# Patient Record
Sex: Female | Born: 1975 | Race: White | Hispanic: No | Marital: Married | Smoking: Never smoker
Health system: Southern US, Community
[De-identification: ages and names within clinical notes are randomized; demographics above are authoritative.]

---

## 2013-05-12 ENCOUNTER — Ambulatory Visit (INDEPENDENT_AMBULATORY_CARE_PROVIDER_SITE_OTHER): Payer: BC Managed Care – PPO | Admitting: Obstetrics and Gynecology

## 2013-05-12 ENCOUNTER — Telehealth: Payer: Self-pay | Admitting: Obstetrics and Gynecology

## 2013-05-12 ENCOUNTER — Encounter: Payer: Self-pay | Admitting: Obstetrics and Gynecology

## 2013-05-12 VITALS — BP 100/70 | HR 80 | Ht 70.0 in | Wt 158.5 lb

## 2013-05-12 DIAGNOSIS — Z01419 Encounter for gynecological examination (general) (routine) without abnormal findings: Secondary | ICD-10-CM

## 2013-05-12 DIAGNOSIS — Z0000 Encounter for general adult medical examination without abnormal findings: Secondary | ICD-10-CM

## 2013-05-12 LAB — POCT URINALYSIS DIPSTICK
Bilirubin, UA: NEGATIVE
Blood, UA: NEGATIVE
Glucose, UA: NEGATIVE
Ketones, UA: NEGATIVE
Leukocytes, UA: NEGATIVE
Nitrite, UA: NEGATIVE
pH, UA: 5

## 2013-05-12 MED ORDER — NORETHINDRONE 0.35 MG PO TABS: 1.0000 | ORAL_TABLET | Freq: Every day | ORAL | Status: DC

## 2013-05-12 NOTE — Progress Notes (Signed)
Patient ID: Anna Sellers, female   DOB: 05-Dec-1975, 37 y.o.   MRN: [id number] GYNECOLOGY VISIT  PCP: NONE  Referring provider:   HPI: 37 y.o.   Married  Caucasian  female   G2P2002 with No LMP recorded. Patient is not currently having periods (Reason: Lactating).   here for  AEX Had a vaginal delivery 7 months ago.   Breast feeding.  On Camilla birth control.   Wants refill.  Patient had prolapse after first delivery and used Colpectrin.  Not having any recurrence of symptoms.  No real urinary incontinence.  Voiding pattern is somewhat intermittent.    Labs today:    Hgb: not done  Urine:  Neg  GYNECOLOGIC HISTORY: No LMP recorded. Patient is not currently having periods (Reason: Lactating). Menses:  breastfeeding Bleeding:  none  Sexually active:  yes Partner preference: female Contraception:  oral progesterone-only contraceptive Hormone therapy:  DES exposure:  denies Blood transfusions:  none Sexually transmitted diseases:  The patient denies history of sexually transmitted disease. Previous GYN Procedures:  none Last mammogram:  normal Date: January 2013 - density tissue noted in general.  No specific mass seen.  Never had any follow up as became pregnant.      Location:    [place]. Last pap:  normal Date:  2013? History of abnormal pap smear:  no   OB History   Grav Para Term Preterm Abortions TAB SAB Ect Mult Living   2 2 2       2        LIFESTYLE: Exercise:   walking         Tobacco:  no Alcohol: 5-7 glasses of wine/beer per week Drug use:  no  OTHER HEALTH MAINTENANCE: Last tetanus/TDap:   Fall 2013.  Gardisil: never Last Influenza:  06/2012 Zostavax:  never  Last bone density: never Last colonoscopy: never  Last cholesterol check: 2013 wnl  Family History  Problem Relation Age of Onset  . Breast cancer Mother   . Hypertension Mother   . Hyperlipidemia Mother   . Diabetes Father     diet controlled    There are no active problems to display for  this patient.   History reviewed. No pertinent past medical history.  History reviewed. No pertinent past surgical history.  ALLERGIES: Review of patient's allergies indicates no known allergies.  Current Outpatient Prescriptions  Medication Sig Dispense Refill  . norethindrone (ERRIN) 0.35 MG tablet Take 1 tablet by mouth daily.       No current facility-administered medications for this visit.     ROS:  Pertinent items are noted in HPI.  SOCIAL HISTORY:    Married.  Husband is a IT consultant in music at [employer].  They were living in [place] before moving to [place].   PHYSICAL EXAMINATION:    BP 100/70  Pulse 80  Ht 5\' 10"  (1.778 m)  Wt 158 lb 8 oz (71.895 kg)  BMI 22.74 kg/m2   Wt Readings from Last 3 Encounters:  05/12/13 158 lb 8 oz (71.895 kg)     Ht Readings from Last 3 Encounters:  05/12/13 5\' 10"  (1.778 m)    General appearance: alert, cooperative and appears stated age Head: Normocephalic, without obvious abnormality, atraumatic Neck: no adenopathy, supple, symmetrical, trachea midline and thyroid not enlarged, symmetric, no tenderness/mass/nodules Lungs: clear to auscultation bilaterally Breasts: Inspection negative, No nipple retraction or dimpling, No nipple discharge or bleeding, No axillary or supraclavicular adenopathy, Normal to palpation without dominant  masses Heart: regular rate and rhythm Abdomen: soft, non-tender;  no masses,  no organomegaly Extremities: extremities normal, atraumatic, no cyanosis or edema Skin: Skin color, texture, turgor normal. No rashes or lesions Lymph nodes: Cervical, supraclavicular, and axillary nodes normal. No abnormal inguinal nodes palpated Neurologic: Grossly normal   Pelvic: External genitalia:  no lesions              Urethra:  normal appearing urethra with no masses, tenderness or lesions              Bartholins and Skenes: normal                 Vagina: normal appearing vagina with normal color and  discharge, no lesions              Cervix: normal appearance              Pap taken: yes.  High risk HPV testing yes.        Bimanual Exam:  Uterus:  uterus is normal size, shape, consistency and nontender                                      Adnexa: normal adnexa in size, nontender and no masses                                        ASSESSMENT Normal gynecologic exam. Breast feeding. Prior mammogram showing a dense tissue in general.   PLAN  Mammogram age 16 and after stopping breast feeding for three months. Pap smear and high risk HPV testing Counseled on breast self exam, mammography screening STD screening performed:  no. Declines labs. Medications per Epic orders - Camilla. Patient may choose to switch to combined OCP when she complete childbearing.  Return annually or prn   An After Visit Summary was printed and given to the patient.

## 2013-05-12 NOTE — Patient Instructions (Addendum)

## 2014-01-28 ENCOUNTER — Ambulatory Visit
Admission: RE | Admit: 2014-01-28 | Discharge: 2014-01-28 | Disposition: A | Discharge status: Home / Self Care | Payer: BC Managed Care – PPO | Source: Ambulatory Visit | Attending: Family Medicine | Admitting: Family Medicine

## 2014-01-28 ENCOUNTER — Other Ambulatory Visit: Payer: Self-pay | Admitting: Family Medicine

## 2014-01-28 DIAGNOSIS — M545 Low back pain, unspecified: Secondary | ICD-10-CM

## 2014-01-28 DIAGNOSIS — M25559 Pain in unspecified hip: Secondary | ICD-10-CM

## 2014-02-16 ENCOUNTER — Other Ambulatory Visit: Payer: Self-pay | Admitting: Obstetrics and Gynecology

## 2014-02-16 ENCOUNTER — Telehealth: Payer: Self-pay | Admitting: Obstetrics and Gynecology

## 2014-02-16 MED ORDER — NORETHIN ACE-ETH ESTRAD-FE 1-20 MG-MCG PO TABS: 1.0000 | ORAL_TABLET | Freq: Every day | ORAL | Status: DC

## 2014-02-16 NOTE — Telephone Encounter (Signed)
Rx sent to her pharmacy for the [place] 1/20 for 3 months until annual exam is due.  Use back up protection for the first month.

## 2014-02-16 NOTE — Telephone Encounter (Signed)
Patient calling wanting to switch to another birth control since she has weaned her daughter. She wants to switch from micronor to [facility] 1/20.   [facility]  [address]

## 2014-02-17 NOTE — Telephone Encounter (Signed)
Patient notified.  Verbalized understanding. 

## 2014-05-05 ENCOUNTER — Telehealth: Payer: Self-pay | Admitting: Obstetrics and Gynecology

## 2014-05-05 MED ORDER — NORETHIN ACE-ETH ESTRAD-FE 1-20 MG-MCG PO TABS: 1.0000 | ORAL_TABLET | Freq: Every day | ORAL | Status: DC

## 2014-05-05 NOTE — Telephone Encounter (Signed)
Last refilled: 02/16/14 #3 packs with no refills Last AEX: 05/12/13 with Dr. Leretha Dykes Scheduled: 05/20/14 with Dr. Moody Bruins 1/20 #1 pack with1 refill sent to pharmacy to last patient until AEX.  LM on patient's vm (confirmed name) that rx has been sent.  Routed to provider for review, encounter closed.

## 2014-05-05 NOTE — Telephone Encounter (Signed)
Patient needs refill for norethindrone-ethinyl estradiol (JUNEL FE,GILDESS FE,LOESTRIN FE) 1-20 MG-MCG tablet  Take 1 tablet by mouth daily., Starting 02/16/2014, Until Discontinued, Normal, Last Dose: Not Recorded  Refills: 2 ordered Pharmacy: [facility] [place]   aex scheduled for 05/20/14

## 2014-05-20 ENCOUNTER — Encounter: Payer: Self-pay | Admitting: Obstetrics and Gynecology

## 2014-05-20 ENCOUNTER — Ambulatory Visit (INDEPENDENT_AMBULATORY_CARE_PROVIDER_SITE_OTHER): Payer: BC Managed Care – PPO | Admitting: Obstetrics and Gynecology

## 2014-05-20 VITALS — BP 102/80 | HR 72 | Resp 18 | Ht 70.0 in | Wt 160.0 lb

## 2014-05-20 DIAGNOSIS — Z01419 Encounter for gynecological examination (general) (routine) without abnormal findings: Secondary | ICD-10-CM

## 2014-05-20 MED ORDER — NORETHIN ACE-ETH ESTRAD-FE 1-20 MG-MCG PO TABS: 1.0000 | ORAL_TABLET | Freq: Every day | ORAL | Status: DC

## 2014-05-20 NOTE — Progress Notes (Signed)
GYNECOLOGY VISIT  PCP:   Referring provider:   HPI: 38 y.o.   Married  Caucasian  female   G2P2002 with Patient's last menstrual period was 05/08/2014.   here for   Annual Gynecological Examination  Menses are light and regular on OCPs.  Wants to continue.  Happy with this.   No leakage of urine or difficulty with bowel function.   Hgb:  PCP Urine:  PCP  GYNECOLOGIC HISTORY: Patient's last menstrual period was 05/08/2014. Sexually active:  yes Partner preference: female Contraception:   OCP Menopausal hormone therapy: None DES exposure: None   Blood transfusions: None   Sexually transmitted diseases: None   GYN procedures and prior surgeries: None Last mammogram:  12/2011 Normal               Last pap and high risk HPV testing:   04/2013 Neg. HR HPV neg History of abnormal pap smear:  No   OB History   Grav Para Term Preterm Abortions TAB SAB Ect Mult Living   2 2 2       2        LIFESTYLE: Exercise:  Yes, swimming, gym 4x weekly              OTHER HEALTH MAINTENANCE: Tetanus/TDap: 2013 HPV: No Influenza: No     Bone density:N/A Colonoscopy:N/A  Cholesterol check: 2014  Family History  Problem Relation Age of Onset  . Breast cancer Mother   . Hypertension Mother   . Hyperlipidemia Mother   . Diabetes Father     diet controlled    There are no active problems to display for this patient.  History reviewed. No pertinent past medical history.  History reviewed. No pertinent past surgical history.  ALLERGIES: Review of patient's allergies indicates no known allergies.  Current Outpatient Prescriptions  Medication Sig Dispense Refill  . ibuprofen (ADVIL,MOTRIN) 800 MG tablet Take 800 mg by mouth every 8 (eight) hours as needed.       . norethindrone-ethinyl estradiol (JUNEL FE,GILDESS FE,LOESTRIN FE) 1-20 MG-MCG tablet Take 1 tablet by mouth daily.  1 Package  1   No current facility-administered medications for this visit.     ROS:  Pertinent items  are noted in HPI.  History   Social History  . Marital Status: Married    Spouse Name: N/A    Number of Children: N/A  . Years of Education: N/A   Occupational History  . Not on file.   Social History Main Topics  . Smoking status: Never Smoker   . Smokeless tobacco: Never Used  . Alcohol Use: 3.5 - 6.0 oz/week    7-12 drink(s) per week     Comment: 5-7 glasses of wine/beer per week  . Drug Use: No  . Sexual Activity: Yes    Birth Control/ Protection: Pill     Comment: Errin   Other Topics Concern  . Not on file   Social History Narrative  . No narrative on file    PHYSICAL EXAMINATION:    BP 102/80  Pulse 72  Resp 18  Ht 5' 10"  (1.778 m)  Wt 160 lb (72.576 kg)  BMI 22.96 kg/m2  LMP 05/08/2014   Wt Readings from Last 3 Encounters:  05/20/14 160 lb (72.576 kg)  05/12/13 158 lb 8 oz (71.895 kg)     Ht Readings from Last 3 Encounters:  05/20/14 5' 10"  (1.778 m)  05/12/13 5' 10"  (1.778 m)    General appearance: alert, cooperative and  appears stated age Head: Normocephalic, without obvious abnormality, atraumatic Neck: no adenopathy, supple, symmetrical, trachea midline and thyroid not enlarged, symmetric, no tenderness/mass/nodules Lungs: clear to auscultation bilaterally Breasts: Inspection negative, No nipple retraction or dimpling, No nipple discharge or bleeding, No axillary or supraclavicular adenopathy, Normal to palpation without dominant masses Heart: regular rate and rhythm Abdomen: soft, non-tender; no masses,  no organomegaly Extremities: extremities normal, atraumatic, no cyanosis or edema Skin: Skin color, texture, turgor normal. No rashes or lesions Lymph nodes: Cervical, supraclavicular, and axillary nodes normal. No abnormal inguinal nodes palpated Neurologic: Grossly normal  Pelvic: External genitalia:  no lesions              Urethra:  normal appearing urethra with no masses, tenderness or lesions              Bartholins and Skenes: normal                  Vagina: normal appearing vagina with normal color and discharge, no lesions              Cervix: normal appearance              Pap and high risk HPV testing done: No.        Bimanual Exam:  Uterus:  uterus is normal size, shape, consistency and nontender                                      Adnexa: normal adnexa in size, nontender and no masses                                      Rectovaginal:  Yes.                                        Confirms above.                                      Anus:  normal sphincter tone, no lesions  ASSESSMENT  Normal gynecologic exam.  PLAN  Mammogram recommended yearly starting at age 35.  Patient will schedule at [facility].  Pap smear and high risk HPV testing as above. Rx for LoEstrin 1/20.  See orders. Counseled on self breast exam, Calcium and vitamin D intake, exercise, Kegels. See lab orders: No. Return annually or prn   An After Visit Summary was printed and given to the patient.

## 2014-05-20 NOTE — Patient Instructions (Signed)

## 2014-05-21 ENCOUNTER — Other Ambulatory Visit: Payer: Self-pay

## 2014-05-21 DIAGNOSIS — Z1231 Encounter for screening mammogram for malignant neoplasm of breast: Secondary | ICD-10-CM

## 2014-06-08 ENCOUNTER — Ambulatory Visit
Admission: RE | Admit: 2014-06-08 | Discharge: 2014-06-08 | Disposition: A | Discharge status: Home / Self Care | Payer: BC Managed Care – PPO | Source: Ambulatory Visit

## 2014-06-08 DIAGNOSIS — Z1231 Encounter for screening mammogram for malignant neoplasm of breast: Secondary | ICD-10-CM

## 2014-07-20 ENCOUNTER — Encounter: Payer: Self-pay | Admitting: Obstetrics and Gynecology

## 2015-04-21 ENCOUNTER — Encounter: Payer: Self-pay | Admitting: Sports Medicine

## 2015-04-21 ENCOUNTER — Ambulatory Visit (INDEPENDENT_AMBULATORY_CARE_PROVIDER_SITE_OTHER): Payer: BC Managed Care – PPO | Admitting: Sports Medicine

## 2015-04-21 VITALS — BP 143/89 | Ht 70.0 in | Wt 155.0 lb

## 2015-04-21 DIAGNOSIS — R269 Unspecified abnormalities of gait and mobility: Secondary | ICD-10-CM | POA: Insufficient documentation

## 2015-04-21 DIAGNOSIS — M2141 Flat foot [pes planus] (acquired), right foot: Secondary | ICD-10-CM | POA: Diagnosis not present

## 2015-04-21 DIAGNOSIS — M2142 Flat foot [pes planus] (acquired), left foot: Secondary | ICD-10-CM

## 2015-04-21 DIAGNOSIS — M25551 Pain in right hip: Secondary | ICD-10-CM

## 2015-04-21 DIAGNOSIS — M6798 Unspecified disorder of synovium and tendon, other site: Secondary | ICD-10-CM | POA: Diagnosis not present

## 2015-04-21 DIAGNOSIS — M67951 Unspecified disorder of synovium and tendon, right thigh: Secondary | ICD-10-CM | POA: Insufficient documentation

## 2015-04-21 NOTE — Assessment & Plan Note (Signed)
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 Negative low back and hip imaging.  No suggestion of hip joint or SI joint pathology based on exam.  She does appear to have some asymmetry in her right>left hip stabilizing muscles and had a + trendelenberg on the the right.   - In addition to current core strengthening work, we have given Anna Sellers a handout to work on straight leg hip abduction as well as standing rotation.  - She can take NSAIDs PRN, but this should not be a long term answer.  An injection may also be a possibility in the future.  - We also added lateral ray posts to inserts to help with her over supination.  She has had orthotics in the past that had helped her.  - f/u in 6 weeks.

## 2015-04-21 NOTE — Progress Notes (Signed)
  Anna Sellers - 39 y.o. female MRN [id number]  Date of birth: March 19, 1976  CC: Right Hip Buttock  SUBJECTIVE:   HPI Right>Left Hip/Buttock pain x 4 years:  -  The pain is aching on the lateral edge of her glut.  No radiating pain down the leg.  No loss of bowel or bladder control.  No pain with bending forward or backwards.  - previously walked more until this pain began - Sitting bothers her in addition with walking.  - Started/aggravated during pregnancies in the last few years.  - Aggravated during workout/kickboxing class following the first pregnancy - She has tried a long term course of ibuprofen (2 weeks, 874m). This did help, but the pain returned.  PRN ibuprofen also seems to help.  - No pain down the leg. Some back soreness as well.  - Still does yoga several times a week as well as swimming 4 miles/week (avoids breast stroke.  - She recently underwent a 4 month course of PT that fail to resolve her symptoms.  They used various modalities to treat pain.      - One exercise that hurt her SI joint were planks, this pain was different than her normal pain.     - She has been doing many core strengthening exercises, but nothing specifically for leg abduction strength   HISTORY: Past Medical, Surgical, Social, and Family History Reviewed & Updated per EMR.    OBJECTIVE: BP 143/89 mmHg  Ht 5' 10"  (1.778 m)  Wt 155 lb (70.308 kg)  BMI 22.24 kg/m2  Physical Exam  Calm, no distress Resp: non-labored, speaking in full sentences Skin: no rash or obvious skin lesion. Back Exam: Inspection: negative Motion: unrestricted. SLR seated: negative Seated HS Flexibility: to 0 degrees b/l Palpable tenderness: none FABER: negative Sensory change: symmetric Reflex change: symmetric  Strength at foot Plantar-flexion:  5/ 5    Dorsi-flexion: 5 / 5    Eversion:  5/ 5   Inversion: 5 / 5 Leg strength equal Quad: 5 / 5   Hamstring: 5 / 5   Hip flexor:  5/ 5   Hip abductors:  5/ 5 Gait:  supinates Stands with feet minimally externally rotated Pes planus.  Hip: b/l ROM IR: 80 Deg, ER: 80 Deg, Flexion: 120 Deg, Extension: 100 Deg, Abduction: 45 Deg, Adduction: 45 Deg Strength IR: 5/5, ER: 5/5, Flexion: 5/5, Extension: 5/5, Abduction: 5/5 (not as strong as possible), Adduction: 5/5 Pelvic alignment unremarkable to inspection and palpation. Standing hip rotation and gait with trendelenburg when standing on right >left leg.   Greater trochanter without tenderness to palpation.  No tenderness over piriformis origin, but pain was present over the insertion of the hip abductors/external rotators.  No SI joint tenderness and normal minimal SI movement. Negative compression and distraction.  MEDICATIONS, LABS & OTHER ORDERS: Previous Medications   IBUPROFEN (ADVIL,MOTRIN) 800 MG TABLET    Take 800 mg by mouth every 8 (eight) hours as needed.    NORETHINDRONE-ETHINYL ESTRADIOL (JUNEL FE,GILDESS FE,LOESTRIN FE) 1-20 MG-MCG TABLET    Take 1 tablet by mouth daily.   Modified Medications   No medications on file   New Prescriptions   No medications on file   Discontinued Medications   No medications on file  No orders of the defined types were placed in this encounter.   ASSESSMENT & PLAN: See problem based charting & AVS for pt instructions.

## 2015-05-03 ENCOUNTER — Encounter: Payer: Self-pay | Admitting: Obstetrics and Gynecology

## 2015-05-03 ENCOUNTER — Ambulatory Visit (INDEPENDENT_AMBULATORY_CARE_PROVIDER_SITE_OTHER): Payer: BC Managed Care – PPO | Admitting: Obstetrics and Gynecology

## 2015-05-03 VITALS — BP 110/76 | HR 70 | Resp 18 | Ht 70.0 in | Wt 156.8 lb

## 2015-05-03 DIAGNOSIS — Z01419 Encounter for gynecological examination (general) (routine) without abnormal findings: Secondary | ICD-10-CM

## 2015-05-03 DIAGNOSIS — Z0000 Encounter for general adult medical examination without abnormal findings: Secondary | ICD-10-CM | POA: Diagnosis not present

## 2015-05-03 LAB — POCT URINALYSIS DIPSTICK
Bilirubin, UA: NEGATIVE
Glucose, UA: NEGATIVE
Ketones, UA: NEGATIVE
Leukocytes, UA: NEGATIVE
Nitrite, UA: NEGATIVE
PH UA: 5
Protein, UA: NEGATIVE
RBC UA: NEGATIVE
UROBILINOGEN UA: NEGATIVE

## 2015-05-03 LAB — LIPID PANEL
CHOLESTEROL: 173 mg/dL (ref 125–200)
HDL: 67 mg/dL (ref >=46)
LDL Cholesterol: 87 mg/dL (ref <130)
TRIGLYCERIDES: 93 mg/dL (ref <150)
Total CHOL/HDL Ratio: 2.6 Ratio (ref <=5.0)
VLDL: 19 mg/dL (ref <30)

## 2015-05-03 LAB — COMPREHENSIVE METABOLIC PANEL
ALK PHOS: 39 U/L (ref 33–115)
ALT: 14 U/L (ref 6–29)
AST: 15 U/L (ref 10–30)
Albumin: 4.4 g/dL (ref 3.6–5.1)
BILIRUBIN TOTAL: 0.4 mg/dL (ref 0.2–1.2)
BUN: 9 mg/dL (ref 7–25)
CO2: 25 mmol/L (ref 20–31)
Calcium: 9.6 mg/dL (ref 8.6–10.2)
Chloride: 104 mmol/L (ref 98–110)
Creat: 0.84 mg/dL (ref 0.50–1.10)
Glucose, Bld: 96 mg/dL (ref 65–99)
POTASSIUM: 4.3 mmol/L (ref 3.5–5.3)
Sodium: 142 mmol/L (ref 135–146)
TOTAL PROTEIN: 6.9 g/dL (ref 6.1–8.1)

## 2015-05-03 LAB — CBC
HCT: 38.4 % (ref 36.0–46.0)
Hemoglobin: 13.2 g/dL (ref 12.0–15.0)
MCH: 30.7 pg (ref 26.0–34.0)
MCHC: 34.4 g/dL (ref 30.0–36.0)
MCV: 89.3 fL (ref 78.0–100.0)
MPV: 9.7 fL (ref 8.6–12.4)
PLATELETS: 306 10*3/uL (ref 150–400)
RBC: 4.3 MIL/uL (ref 3.87–5.11)
RDW: 13.2 % (ref 11.5–15.5)
WBC: 7.2 10*3/uL (ref 4.0–10.5)

## 2015-05-03 MED ORDER — NORETHIN ACE-ETH ESTRAD-FE 1-20 MG-MCG PO TABS: 1.0000 | ORAL_TABLET | Freq: Every day | ORAL | Status: DC

## 2015-05-03 NOTE — Progress Notes (Signed)
Patient ID: Anna Sellers, female   DOB: 08/04/1976, 39 y.o.   MRN: [id number] 39 y.o. G2P2002 Married Caucasian female here for annual exam.    Patient on OCPs--not continuously but doesn't have a cycle. This occurring for 1 year since weaned off breast feeding.  Child is now 48.30 years old. No missed pills.  Does pregnancy test every now and then and her tests are negative.  Happy not to have cycles. No hot flashes or night sweats.  No history of post partum hemorrhage. No nipple discharge.  Right gluteal and hip pain.  Doing PT.  Saw a new MD recently.   Daughter started kindergarten.   PCP:   Antony Contras, MD  No LMP recorded. Patient is not currently having periods (Reason: Oral contraceptives).          Sexually active: Yes.   female The current method of family planning is OCP (estrogen/progesterone).--Loestrin Fe  Exercising: Yes.    swimming, yoga and zumba. Smoker:  no  Health Maintenance: Pap:  05-12-13 Neg:Neg HR HPV History of abnormal Pap:  no MMG:  06-08-14 Density Cat.C/Neg:[facility] Colonoscopy:  n/a BMD:   n/a  Result  n/a TDaP:  2013 Screening Labs:  Hb today:--- Urine today: Neg   reports that she has never smoked. She has never used smokeless tobacco. She reports that she drinks about 3.5 - 6.0 oz of alcohol per week. She reports that she does not use illicit drugs.  History reviewed. No pertinent past medical history.  History reviewed. No pertinent past surgical history.  Current Outpatient Prescriptions  Medication Sig Dispense Refill  . ibuprofen (ADVIL,MOTRIN) 800 MG tablet Take 800 mg by mouth every 8 (eight) hours as needed.     . norethindrone-ethinyl estradiol (JUNEL FE,GILDESS FE,LOESTRIN FE) 1-20 MG-MCG tablet Take 1 tablet by mouth daily. 3 Package 3   No current facility-administered medications for this visit.    Family History  Problem Relation Age of Onset  . Breast cancer Mother   . Hypertension Mother   . Hyperlipidemia Mother    . Diabetes Father     diet controlled    ROS:  Pertinent items are noted in HPI.  Otherwise, a comprehensive ROS was negative.  Exam:   BP 110/76 mmHg  Pulse 70  Resp 18  Ht 5' 10"  (1.778 m)  Wt 156 lb 12.8 oz (71.124 kg)  BMI 22.50 kg/m2  LMP     General appearance: alert, cooperative and appears stated age Head: Normocephalic, without obvious abnormality, atraumatic Neck: no adenopathy, supple, symmetrical, trachea midline and thyroid normal to inspection and palpation Lungs: clear to auscultation bilaterally Breasts: normal appearance, no masses or tenderness, Inspection negative, No nipple retraction or dimpling, No nipple discharge or bleeding Heart: regular rate and rhythm Abdomen: soft, non-tender; bowel sounds normal; no masses,  no organomegaly Extremities: extremities normal, atraumatic, no cyanosis or edema Skin: Skin color, texture, turgor normal. No rashes or lesions Lymph nodes: Cervical, supraclavicular, and axillary nodes normal. No abnormal inguinal nodes palpated Neurologic: Grossly normal  Pelvic: External genitalia:  no lesions              Urethra:  normal appearing urethra with no masses, tenderness or lesions              Bartholins and Skenes: normal                 Vagina: normal appearing vagina with normal color and discharge, no lesions  Cervix: no lesions              Pap taken: No. Bimanual Exam:  Uterus:  normal size, contour, position, consistency, mobility, non-tender              Adnexa: normal adnexa and no mass, fullness, tenderness              Rectovaginal: Yes.  .  Confirms.              Anus:  normal sphincter tone, no lesions  Chaperone was present for exam.  Assessment:   Well woman visit with normal exam. Amenorrhea on combined OCPs.  Recent pregnancy 2.5 years ago.  FH of breast cancer.   Plan: Yearly mammogram recommended after age 65. Patient will schedule 3D. Recommended self breast exam.  Pap and HR HPV as  above. Discussed Calcium, Vitamin D, regular exercise program including cardiovascular and weight bearing exercise. Labs performed.  Yes.  .   See orders.  TSH, Lipids, CMP, CBC.  Refills given on medications.  Yes.  .  See orders.  Loestrin 1/20 for one year.  If want to test hormone levels further, will need to stop OCPs for 2 weeks.  I think it is OK not to do this at this time.   Follow up annually and prn.      After visit summary provided.

## 2015-05-04 ENCOUNTER — Telehealth: Payer: Self-pay

## 2015-05-04 LAB — TSH: TSH: 0.984 u[IU]/mL (ref 0.350–4.500)

## 2015-05-04 NOTE — Telephone Encounter (Signed)
Left message to call Alexias Margerum at [phone]. 

## 2015-05-04 NOTE — Telephone Encounter (Signed)
-----   Message from Nunzio Cobbs, MD sent at 05/04/2015  7:20 AM EDT ----- Please report normal labs to patient including lipids, TSH, CBC, and CMP.   Cc- Marisa Sprinkles

## 2015-05-04 NOTE — Telephone Encounter (Signed)
Spoke with patient. Advised of results as seen below from [place]. Patient is agreeable and verbalizes understanding.  Routing to provider for final review. Patient agreeable to disposition. Will close encounter.   Patient aware provider will review message and nurse will return call if any additional advice or change of disposition.

## 2015-05-10 ENCOUNTER — Other Ambulatory Visit: Payer: Self-pay

## 2015-05-10 DIAGNOSIS — Z1231 Encounter for screening mammogram for malignant neoplasm of breast: Secondary | ICD-10-CM

## 2015-06-02 ENCOUNTER — Encounter: Payer: Self-pay | Admitting: Sports Medicine

## 2015-06-02 ENCOUNTER — Ambulatory Visit (INDEPENDENT_AMBULATORY_CARE_PROVIDER_SITE_OTHER): Payer: BC Managed Care – PPO | Admitting: Sports Medicine

## 2015-06-02 VITALS — BP 137/80 | Ht 70.0 in | Wt 157.0 lb

## 2015-06-02 DIAGNOSIS — M2142 Flat foot [pes planus] (acquired), left foot: Secondary | ICD-10-CM | POA: Diagnosis not present

## 2015-06-02 DIAGNOSIS — M25551 Pain in right hip: Secondary | ICD-10-CM | POA: Diagnosis not present

## 2015-06-02 DIAGNOSIS — M2141 Flat foot [pes planus] (acquired), right foot: Secondary | ICD-10-CM

## 2015-06-02 NOTE — Progress Notes (Signed)
   Subjective:    Patient ID: Michail Jewels, female    DOB: [birth year], 39 y.o.   MRN: [id number]  HPI   Patient comes in today for follow-up on chronic right hip pain. When she initially attempted to do her hip exercises she began to experience increasing pain. As a consequence she decided to not only stop the exercises for a period of time but to also discontinue swimming and yoga. She initially had some symptom improvement but when her pain returned she decided to reintroduce the hip exercises. This time she did them with resistance and has noticed some improvement. She still localizes some of her discomfort to her right SI joint but she also gets pain along the posterior lateral right hip. She has not really had a chance to use her new inserts. They're in her tennis shoes and she wears flats most of the time. She would like to try some inserts in shoes instead. She has worked in the past with Barbaraann Barthel and although she had a good experience she is asking if there are other therapists in town that she may be able to work with. She continues to deny groin pain. She continues to deny numbness or tingling. Her symptoms have been present for about 4 years and started after a pregnancy.    Review of Systems     Objective:   Physical Exam Well-developed, thin appearing. No acute distress. Awake alert and oriented 3. Vital signs reviewed.  Lumbar spine: Good lumbar range of motion. Positive Faber's on the right. Negative straight leg raise Right hip: Smooth painless hip range of motion. Negative logroll. She does have improvement with resisted hip abduction and has a less noticeable Trendelenburg than on her previous visit. She is tender to palpation along the posterior lateral hip diffusely.  Walking without a limp  X-rays of her right hip and lumbar spine are reviewed. She has lumbarlization of the S1 segment. Otherwise unremarkable.       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 She can then transition into step ups and crossovers. I've also given her some more core exercises, specifically bridging and pointer poses. I do think she would do well with some supervised physical therapy. I provided her with the name of Kym Groom. I've also provided her with the information for PT Pilates. I've given her a new pair of green sports insoles for her flats to which I have added a lateral post. If these are uncomfortable she may return to using her other inserts. I would like to see her back in the office in 8 weeks. I think the lumbaralization at the S1 segment is likely providing a little extra motion in the lower lumbar spine which is all the more reason for her to work diligently on core strengthening. I think she is safe to continue recreational exercise using pain as her guide.

## 2015-06-17 ENCOUNTER — Ambulatory Visit
Admission: RE | Admit: 2015-06-17 | Discharge: 2015-06-17 | Disposition: A | Discharge status: Home / Self Care | Payer: BC Managed Care – PPO | Source: Ambulatory Visit

## 2015-06-17 DIAGNOSIS — Z1231 Encounter for screening mammogram for malignant neoplasm of breast: Secondary | ICD-10-CM

## 2015-07-21 ENCOUNTER — Ambulatory Visit (INDEPENDENT_AMBULATORY_CARE_PROVIDER_SITE_OTHER): Payer: BC Managed Care – PPO | Admitting: Sports Medicine

## 2015-07-21 VITALS — BP 130/71

## 2015-07-21 DIAGNOSIS — M25551 Pain in right hip: Secondary | ICD-10-CM

## 2015-07-21 NOTE — Progress Notes (Signed)
Subjective:    Patient ID: Anna Sellers, female    DOB: [birth year], 39 y.o.   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 This is chronic in nature and has been going on for over 4 years.  At her last appointment she was recommended to go to formal physical therapy with a specified therapist and continue her external rotation exercises in addition to hip abduction. The patient has not gone to the specified therapist. However, she has started to perform to group classes per week of Pilates by a physical therapist. She also attended 2 private sessions with this physical therapist and has been performing home exercises prescribed at that time at least 3 days per week. Her primary reason for stopping her external rotation exercises at home was that they provoked her symptoms and caused too much pain. She also cannot tolerate hip abduction, adduction or rotation exercises with weights on machines at the gym secondary to pain/flaring her symptoms.   The patient has not been doing dedicated core strengthening other than the above. She is not competing in any sports or taking opening exercises. She reports that her pain is most bothersome at night or the end of that today. Also provoked by having to pick up/put down her children several times a day.  She rates her pain as approximately 15-25 percent better since her last appointment   Past medical history, past surgical history both reviewed Medications reviewed; unremarkable aside for over-the-counter ibuprofen and OCP Social history; rare/social EtOH. Nonsmoker. Works as a Network engineer at [employer].   Review of Systems 10 point review of systems was negative other than the above    Objective:   Physical Exam  General: -- Well appearing and in NAD; resting comfortably on exam  table  Cardiopulmonary: -- Non-labored respirations  Skin/derm: -- No rashes present on the examined skin of abdomen/chest/back and the bilateral lower extremities  Psych: -- Appropriate mood/affect; normal thought content  Musculoskeletal/neurology exam: -No abnormalities on inspection of the bilateral hips and low back -4+/5 strength with hip external rotation, adduction and abduction on the right side. -5/5 strength in those movements of the left hip; and 5/5 strength in knee extension, hip flexion, great toe extension and dorsiflexion/plantarflexion of the ankle bilaterally  -2/4 DTRs of the patellar and Achilles tendons bilaterally  -Negative straight leg raise bilaterally; negative Faber bilaterally; negative slump test bilaterally  -The patient is exquisitely tender to palpation along the piriformis muscle and other external rotators of the hip. -She is also very tender to palpation on the posterior aspect of the greater trochanter  -She had a negative noble test and negative Ober test    Assessment & Plan:   #1. 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 She's had limited improvement with bodies with a physical therapist. She has not been completing her external rotation exercises nor using any weighted exercises at the gym and they provoked too many symptoms. She has not seen a physical therapist as recommended at the last appointment. As she was hoping that Pilates with a physical therapist would resolve symptoms or provide enough improvement.  Discussed her limited improvement in great detail. The patient agrees that her current improvement is not at goal, especially having undergone FOUR different rounds of dedicated physical  therapy (including Pilates) over the last 4 years. She agrees that a definitive answer is in her best interest.  Plan: -Begin dedicated physical therapy  for piriformis syndrome and right hip pain with the physical therapist recommended by our office (this will be the fifth round of therapy) -Follow-up with sports medicine in 4-6 weeks -MRI of lumbar spine and MRI of right hip if no improvement with most recent therapy -May continue acetaminophen and ibuprofen as needed for pain

## 2015-11-08 ENCOUNTER — Other Ambulatory Visit: Payer: Self-pay | Admitting: Sports Medicine

## 2015-11-08 ENCOUNTER — Ambulatory Visit (INDEPENDENT_AMBULATORY_CARE_PROVIDER_SITE_OTHER): Payer: BC Managed Care – PPO | Admitting: Sports Medicine

## 2015-11-08 ENCOUNTER — Encounter: Payer: Self-pay | Admitting: Sports Medicine

## 2015-11-08 VITALS — BP 120/55 | Ht 70.0 in | Wt 157.0 lb

## 2015-11-08 DIAGNOSIS — M6798 Unspecified disorder of synovium and tendon, other site: Secondary | ICD-10-CM

## 2015-11-08 DIAGNOSIS — M25551 Pain in right hip: Secondary | ICD-10-CM

## 2015-11-08 DIAGNOSIS — M67951 Unspecified disorder of synovium and tendon, right thigh: Secondary | ICD-10-CM

## 2015-11-08 DIAGNOSIS — M67959 Unspecified disorder of synovium and tendon, unspecified thigh: Secondary | ICD-10-CM

## 2015-11-08 NOTE — Progress Notes (Signed)
   Subjective:    Patient ID: Anna Sellers, female    DOB: June 12, 1976, 40 y.o.   MRN: [id number]  HPI   Patient comes in today for follow-up on chronic right-sided low back pain and posterior right hip pain. She has been working with Kym Groom (PT) and as a result her symptoms have improved but she will still have episodes of rather severe pain that limits her activity. Symptoms appear to localize to the right SI joint but she also gets pain into the right buttock as well. No numbness or tingling into her legs. 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 Physical therapy feels like many of her symptoms may be originating from the SI joint. She denies any groin pain. This is a chronic problem that has been present now for over 4 years.    Review of Systems     Objective:   Physical Exam  Well-developed, well-nourished. No acute distress  Right hip: Smooth painless hip range of motion with a negative log roll. 5/5 strength with resisted hip abduction on the right. No pain currently with FABER testing but this does usually cause her pain when done by the physical therapist. She has no tenderness to palpation along the greater trochanter area no tenderness along the piriformis or gluteus medius. Neurovascularly intact distally.  Patient also exhibits a few signs of hypermobility. She does have about 10 of hyperextension of both elbows. She is able to touch her hands to the floor. She does not exhibit hypermobility at the thumbs or fifth finger.       Assessment & Plan:  Chronic low back pain/posterior hip pain  I think her pain generator is multifactorial. The lumbaralization of the first sacral unit will definitely cause hypermobility of her lumbar spine. In fact, she has generalized hypermobility already. Her SI joint may also be a source of her pain. She needs to continue with generalized hip strengthening with  physical therapy. I am going to refer her for a diagnostic/therapeutic right-sided SI joint injection and the patient will call me a week after her injection to let me know how she is doing. I've explained to her that this injection will help [place] determine how much of her pain is originating from the SI joint which, in turn, will direct further treatment. Of note, she does think that dry needling has been helpful but the cost is becoming prohibitive. I provided her with the name of [facility] and she will call them to see if they do dry needling (it may be covered under her insurance with them). I've also recommended that she try some acupuncture.

## 2015-11-08 NOTE — Patient Instructions (Addendum)
Injection is schedule for:  Thursday 11/11/15 at 830a Check-in time is [place] [address] [place] [address] [phone]  Need to bring a Driver  It will be an one hour appt  No food 4 hours before your appt  You can drink before the appt

## 2015-11-11 ENCOUNTER — Ambulatory Visit
Admission: RE | Admit: 2015-11-11 | Discharge: 2015-11-11 | Disposition: A | Discharge status: Home / Self Care | Payer: BC Managed Care – PPO | Source: Ambulatory Visit | Attending: Sports Medicine | Admitting: Sports Medicine

## 2015-11-11 DIAGNOSIS — M67959 Unspecified disorder of synovium and tendon, unspecified thigh: Secondary | ICD-10-CM

## 2015-11-11 DIAGNOSIS — M25551 Pain in right hip: Secondary | ICD-10-CM

## 2015-11-11 MED ORDER — METHYLPREDNISOLONE ACETATE 40 MG/ML INJ SUSP (RADIOLOG
120.0000 mg | Freq: Once | INTRAMUSCULAR | Status: AC
  Administered 2015-11-11: 120 mg via INTRA_ARTICULAR

## 2015-11-11 MED ORDER — IOHEXOL 180 MG/ML  SOLN
1.0000 mL | Freq: Once | INTRAMUSCULAR | Status: AC | PRN
Start: 2015-11-11 — End: 2015-11-11
  Administered 2015-11-11: 1 mL via INTRA_ARTICULAR

## 2015-11-11 NOTE — Discharge Instructions (Signed)

## 2015-11-18 ENCOUNTER — Telehealth: Payer: Self-pay | Admitting: Sports Medicine

## 2015-11-18 NOTE — Telephone Encounter (Signed)
I spoke with the patient on the phone today. She had a diagnostic/therapeutic SI joint injection done last week. Some of her low back pain has improved but she is still having pain along the posterior hip around to her greater trochanteric bursa. I've explained to her that it is quite possible that she may have 2 different things going on since she did not have complete symptom resolution after her injection. I've encouraged her to continue with physical therapy and to continue to experiment with dry needling. She will follow-up with me in the office in 3 weeks and if her lateral hip pain persists then I could consider a cortisone injection here as well.

## 2015-12-08 ENCOUNTER — Ambulatory Visit (INDEPENDENT_AMBULATORY_CARE_PROVIDER_SITE_OTHER): Payer: BC Managed Care – PPO | Admitting: Sports Medicine

## 2015-12-08 ENCOUNTER — Encounter: Payer: Self-pay | Admitting: Sports Medicine

## 2015-12-08 VITALS — BP 123/86 | HR 75 | Ht 70.0 in | Wt 158.0 lb

## 2015-12-08 DIAGNOSIS — M25551 Pain in right hip: Secondary | ICD-10-CM

## 2015-12-08 NOTE — Progress Notes (Signed)
   Subjective:    Patient ID: Anna Sellers, female    DOB: [birth year], 40 y.o.   MRN: [id number]  HPI   Patient comes in today for follow-up on her chronic right hip pain. She did notice some symptom relief in the posterior hip with a recent diagnostic/therapeutic SI joint injection. However, she continues to have pain along the lateral hip which at times will radiate into the left buttock. She traces it primarily along the course of the piriformis muscle but also has some discomfort over the greater trochanteric area and hamstring. Since her last office visit she took a four-day trip to [place] and, interestingly enough, states that she was completely pain-free during that visit. However, upon her return to [place] and to work, her pain has began to return. She has started to evaluate things like her mattress and her work space to see if those may be contributing factors. In fact, her current mattress is 42 years old and she has recently purchased a new mattress which will arrive in a few days. Her job involves primarily sitting. She also understands that stress can play a role in pain. She did inquire about [facility] at [facility] and she would like a referral to them.    Review of Systems     Objective:   Physical Exam  Well-developed, well-nourished. No acute distress  Right hip: Patient has tenderness to palpation primarily along the area of the piriformis. Some tenderness over the greater trochanteric bursa. Some tenderness at the proximal hamstring as well. Smooth painless hip range of motion with a negative log roll. Neurovascularly intact distally.       Assessment & Plan:   Chronic right hip pain  Some of her symptoms have resolved with the SI joint injection. The fact that she was completely pain-free for 4 days while visiting [place] makes me think that this is definitely a biomechanical issue and not a structural problem. I agree with purchasing a new mattress and  I've also recommended that she get a standing desk at work. I will also make a referral to [facility] for treatment including dry needling. I will schedule the patient to return to the office in one month for ultrasound evaluation of her right hip and possible ultrasound-guided injection (I will schedule this patient in Dr Awanda Mink' clinic on a day that I am also here). I also think that stress may be playing a role in her symptoms and she understands this.

## 2015-12-29 ENCOUNTER — Ambulatory Visit: Payer: BC Managed Care – PPO | Attending: Sports Medicine | Admitting: Physical Therapy

## 2015-12-29 DIAGNOSIS — R252 Cramp and spasm: Secondary | ICD-10-CM | POA: Diagnosis present

## 2015-12-29 DIAGNOSIS — M25551 Pain in right hip: Secondary | ICD-10-CM | POA: Insufficient documentation

## 2015-12-29 DIAGNOSIS — M6281 Muscle weakness (generalized): Secondary | ICD-10-CM | POA: Insufficient documentation

## 2015-12-29 DIAGNOSIS — R262 Difficulty in walking, not elsewhere classified: Secondary | ICD-10-CM | POA: Insufficient documentation

## 2015-12-29 NOTE — Therapy (Signed)
[place], [place], [address] Phone: [phone]   Fax:  [phone]  Physical Therapy Evaluation  Patient Details  Name: Anna Sellers MRN: [id number] Date of Birth: 20-Jun-1976 Referring Provider: Everlean Alstrom MD  Encounter Date: 12/29/2015      PT End of Session - 12/29/15 1246    Visit Number 1   Number of Visits 12   Date for PT Re-Evaluation 02/09/16   Authorization Type BCBS   PT Start Time 1149   PT Stop Time 1240   PT Time Calculation (min) 51 min   Activity Tolerance Patient tolerated treatment well   Behavior During Therapy [facility] for tasks assessed/performed      No past medical history on file.  No past surgical history on file.  There were no vitals filed for this visit.       Subjective Assessment - 12/29/15 1159    Subjective pt is a 40 y.o F with CC of R hip/ low back pain that has been chronic. She reports it start about 5 years ago pt reports doing possibly too much exercise post-partum which she thinks it. She reports the pain has stayed the same for the last year, and has been going to year  and hasn't seen any benefit with the physical therapy. She denies N/T but rpoerts pain to the lateral apsect of the R hip.    Limitations Sitting;Lifting;Standing;Walking;House hold activities  due to pain but able to do it   How long can you sit comfortably? 30 min   How long can you stand comfortably? 15 min   How long can you walk comfortably? 20 min   Diagnostic tests 11/11/2015 SIJ injection under fluoruo   Patient Stated Goals to exercise again, to decrease pain, prolonged walking (for exercise),    Currently in Pain? Yes   Pain Score 2    Pain Location Hip   Pain Orientation Right;Lateral;Posterior   Pain Descriptors / Indicators Aching  irritating   Pain Type Chronic pain   Pain Frequency Constant   Aggravating Factors  too much exercise, any activity   Pain Relieving Factors  ibuprofen, Ice, DTM with ball, stretching,             [facility] PT Assessment - 12/29/15 0001    Assessment   Medical Diagnosis piriformis syndrome of the R hip   Referring Provider Everlean Alstrom MD   Onset Date/Surgical Date --  5 years   Hand Dominance Right   Next MD Visit 02/04/2016   Prior Therapy yes   Precautions   Precaution Comments Avoiding yoga due to possible hypermobility   Restrictions   Weight Bearing Restrictions No   Balance Screen   Has the patient fallen in the past 6 months No   Has the patient had a decrease in activity level because of a fear of falling?  No   Is the patient reluctant to leave their home because of a fear of falling?  No   Home Environment   Living Environment Private residence   Living Arrangements Spouse/significant other;Children   Available Help at Discharge Available PRN/intermittently;Available 24 hours/day   Type of Home House   Home Access Stairs to enter   Entrance Stairs-Number of Steps 1   Home Layout Two level   Alternate Level Stairs-Number of Steps 16   Prior Function   Level of Independence Independent;Independent with basic ADLs   Vocation Full time employment   Air cabin crew direction  prolonged sitting   Cognition   Overall Cognitive Status Within Functional Limits for tasks assessed   Posture/Postural Control   Posture/Postural Control Postural limitations   Postural Limitations Rounded Shoulders;Forward head;Anterior pelvic tilt   ROM / Strength   AROM / PROM / Strength AROM;PROM;Strength   AROM   Overall AROM  Within functional limits for tasks performed  bil   AROM Assessment Site Hip;Lumbar   Strength   Strength Assessment Site Hip   Right/Left Hip Right;Left   Right Hip Flexion 4/5   Right Hip Extension 4/5   Right Hip External Rotation  4/5   Right Hip ABduction 4/5   Left Hip Flexion 4+/5   Left Hip Extension 4+/5   Left Hip External Rotation 4+/5   Left Hip Internal Rotation 4+/5    Left Hip ABduction 4+/5   Palpation   Palpation comment tendneress at the R piriformis, glute medius/ minimius with palpable trigger points noted. Mild atrophy noted during palpation   Special Tests    Special Tests Sacrolliac Tests   Sacroiliac Tests  Sacral Thrust  Forward flexion testing Postive for L posterior rotation   Pelvic Dictraction   Findings Negative   Pelvic Compression   Findings Negative   Sacral thrust    Findings Negative   Ambulation/Gait   Gait Pattern Step-through pattern;Trendelenburg;Antalgic;Decreased stride length   Gait Comments decreased step length on the R compared bil                           PT Education - 12/29/15 1245    Education provided Yes   Education Details evaluation findings, POC, Goals, HEP   Person(s) Educated Patient   Methods Explanation   Comprehension Verbalized understanding          PT Short Term Goals - 12/29/15 1257    PT SHORT TERM GOAL #1   Title pt will be I with inital HEP (01/19/2016)           PT Long Term Goals - 12/29/15 1257    PT LONG TERM GOAL #1   Title pt will be I with all HEP as of last visit (02/09/2016)   Time 6   Period Weeks   Status New   PT LONG TERM GOAL #2   Title pt will improve R hip abductor/ extensor strength to >/= 4+/5 with </=1/10 pain to promote functional endurance (02/09/2016)   Time 6   Period Weeks   Status New   PT LONG TERM GOAL #3   Title pt will demonstrate no palpale trigger points to in the R hip abductors/ piriformis to assist with pain relief of </=1/10 for funcitonal progression (02/09/2016)   Time 6   Period Weeks   Status New   PT LONG TERM GOAL #4   Title Pt will be able stand/ walk for >/= 45 minutes with </=1/10 to assist with personal goal of returning back to walking (02/09/2016   Time 6   Period Weeks   Status New               Plan - 12/29/15 1246    Clinical Impression Statement Anna Sellers presents to [facility] as low complexity evaluation  with CC of chronic R posterior/ lateral hip pain thats been present for over 5 years due. She demonstrates decreased strength in the R hip compared bil. Palpation revealed multiple trigger points in the hip abductors/ extensors, and piriformis. Forward flexion testing was postive  for a R posteriorly rotated innominate, she demonstrates a mild tredelenberg gait pattern on the R with L SLS. she would benefit from physical therapy to decrease pain, decrease trigger points, improve general hip strength, improve endurance and overal maximize her function by addressing the impairments listed.    Rehab Potential Good   PT Frequency 2x / week   PT Duration 6 weeks   PT Treatment/Interventions ADLs/Self Care Home Management;Cryotherapy;Electrical Stimulation;Moist Heat;Therapeutic exercise;Therapeutic activities;Iontophoresis 37m/ml Dexamethasone;Manual techniques;Dry needling;Passive range of motion;Patient/family education;Ultrasound;Taping;Balance training;Stair training   PT Next Visit Plan assess/ review HEP, Dry needling, IASTM, hip strengthening, modalites PRN   PT Home Exercise Plan piriformis stretch, standing hip abduction/ extension with band, reverse clam shell, IT band stretching   Consulted and Agree with Plan of Care Patient      Patient will benefit from skilled therapeutic intervention in order to improve the following deficits and impairments:  Pain, Postural dysfunction, Improper body mechanics, Decreased endurance, Decreased activity tolerance, Decreased balance, Decreased strength, Hypermobility, Difficulty walking, Abnormal gait  Visit Diagnosis: Pain in right hip - Plan: PT plan of care cert/re-cert  Cramp and spasm - Plan: PT plan of care cert/re-cert  Difficulty in walking, not elsewhere classified - Plan: PT plan of care cert/re-cert  Muscle weakness (generalized) - Plan: PT plan of care cert/re-cert     Problem List Patient Active Problem List   Diagnosis Date Noted  .  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 Abnormality of gait 04/21/2015  . Pes planus of both feet 04/21/2015   KStarr LakePT, DPT, LAT, ATC  12/29/2015  1:05 PM      [facility] [address]Phone: [phone]  Fax:  [phone] Name: Anna HousemanMRN: [id number] of Birth: [birth year]

## 2016-01-05 ENCOUNTER — Ambulatory Visit: Payer: BC Managed Care – PPO | Admitting: Family Medicine

## 2016-01-06 ENCOUNTER — Ambulatory Visit: Payer: BC Managed Care – PPO | Admitting: Physical Therapy

## 2016-01-06 DIAGNOSIS — M6281 Muscle weakness (generalized): Secondary | ICD-10-CM

## 2016-01-06 DIAGNOSIS — M25551 Pain in right hip: Secondary | ICD-10-CM

## 2016-01-06 DIAGNOSIS — R262 Difficulty in walking, not elsewhere classified: Secondary | ICD-10-CM

## 2016-01-06 DIAGNOSIS — R252 Cramp and spasm: Secondary | ICD-10-CM

## 2016-01-06 NOTE — Therapy (Signed)
[place], [place], [address] Phone: [phone]   Fax:  [phone]  Physical Therapy Treatment  Patient Details  Name: Anna Sellers MRN: [id number] Date of Birth: 10-11-1975 Referring Provider: Everlean Alstrom MD  Encounter Date: 01/06/2016      PT End of Session - 01/06/16 1234    Visit Number 2   Number of Visits 12   Date for PT Re-Evaluation 02/09/16   Authorization Type BCBS   PT Start Time 1148   PT Stop Time 1238   PT Time Calculation (min) 50 min   Activity Tolerance Patient tolerated treatment well   Behavior During Therapy [facility] for tasks assessed/performed      No past medical history on file.  No past surgical history on file.  There were no vitals filed for this visit.      Subjective Assessment - 01/06/16 1150    Subjective pt reports being consistent being with her HEP, she reports some soreness, on the involved side.    Currently in Pain? Yes   Pain Score 2    Pain Location Hip   Pain Orientation Right;Lateral;Posterior   Pain Descriptors / Indicators Aching   Pain Type Chronic pain   Pain Onset More than a month ago   Pain Frequency Intermittent   Aggravating Factors  tough to tell   Pain Relieving Factors Ibuprofen, DTM with ball,                          [facility] Adult PT Treatment/Exercise - 01/06/16 1229    Knee/Hip Exercises: Stretches   ITB Stretch 2 reps;30 seconds   Modalities   Modalities Moist Heat   Moist Heat Therapy   Number Minutes Moist Heat 10 Minutes   Moist Heat Location Hip   Manual Therapy   Manual Therapy Other (comment);Soft tissue mobilization;Myofascial release   Soft tissue mobilization IASTM over the R piriformis/ glute medius following DN   Myofascial Release stretching/ rolling of the glute medius/piriformis   Other Manual Therapy tack and stretch of the R piriformis 2 x 10          Trigger Point Dry Needling - 01/06/16 1227     Consent Given? Yes   Education Handout Provided Yes  given prevously   Muscles Treated Lower Body Gluteus minimus;Piriformis   Gluteus Minimus Response Twitch response elicited;Palpable increased muscle length   R Medius   Piriformis Response Twitch response elicited;Palpable increased muscle length              PT Education - 01/06/16 1234    Education provided Yes   Education Details dry needling education, [facility] education and benefits   Person(s) Educated Patient   Methods Explanation   Comprehension Verbalized understanding          PT Short Term Goals - 01/06/16 1238    PT SHORT TERM GOAL #1   Title pt will be I with inital HEP (01/19/2016)   Time 3   Period Weeks   Status On-going           PT Long Term Goals - 12/29/15 1257    PT LONG TERM GOAL #1   Title pt will be I with all HEP as of last visit (02/09/2016)   Time 6   Period Weeks   Status New   PT LONG TERM GOAL #2   Title pt will improve R hip abductor/ extensor strength to >/=  4+/5 with </=1/10 pain to promote functional endurance (02/09/2016)   Time 6   Period Weeks   Status New   PT LONG TERM GOAL #3   Title pt will demonstrate no palpale trigger points to in the R hip abductors/ piriformis to assist with pain relief of </=1/10 for funcitonal progression (02/09/2016)   Time 6   Period Weeks   Status New   PT LONG TERM GOAL #4   Title Pt will be able stand/ walk for >/= 45 minutes with </=1/10 to assist with personal goal of returning back to walking (02/09/2016   Time 6   Period Weeks   Status New               Plan - 01/06/16 1235    Clinical Impression Statement Mrs. Laflamme reports feeling sore with her exercises but has been consistent with her HEP. She provided consent for DN of the R piriformis/ glute med/minimus with multiple twitches and relief of tension noted. IASTM and tack and stretch she reported relief of pain and tightness. Utilized [facility] post session for sorness for DN.     PT Next Visit Plan assess response Dry needling, IASTM, hip strengthening, modalites PRN   PT Home Exercise Plan HEP review.    Consulted and Agree with Plan of Care Patient      Patient will benefit from skilled therapeutic intervention in order to improve the following deficits and impairments:  Pain, Postural dysfunction, Improper body mechanics, Decreased endurance, Decreased activity tolerance, Decreased balance, Decreased strength, Hypermobility, Difficulty walking, Abnormal gait  Visit Diagnosis: Pain in right hip  Cramp and spasm  Difficulty in walking, not elsewhere classified  Muscle weakness (generalized)     Problem List Patient Active Problem List   Diagnosis Date Noted  . 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 Abnormality of gait 04/21/2015  . Pes planus of both feet 04/21/2015   [name] PT, DPT, LAT, ATC  01/06/2016  12:42 PM      [place] [facility] [address] [place], [place], [address] Phone: [phone]   Fax:  [phone]  Name: Anna Sellers MRN: [id number] Date of Birth: 02-24-1976

## 2016-01-10 ENCOUNTER — Ambulatory Visit: Payer: BC Managed Care – PPO | Admitting: Physical Therapy

## 2016-01-10 DIAGNOSIS — R252 Cramp and spasm: Secondary | ICD-10-CM

## 2016-01-10 DIAGNOSIS — R262 Difficulty in walking, not elsewhere classified: Secondary | ICD-10-CM

## 2016-01-10 DIAGNOSIS — M25551 Pain in right hip: Secondary | ICD-10-CM | POA: Diagnosis not present

## 2016-01-10 DIAGNOSIS — M6281 Muscle weakness (generalized): Secondary | ICD-10-CM

## 2016-01-10 NOTE — Therapy (Signed)
Anna Sellers, [place], [address] Phone: [phone]   Fax:  [phone]  Physical Therapy Treatment  Patient Details  Name: Anna Sellers MRN: [id number] Date of Birth: 07/17/1976 Referring Provider: Everlean Alstrom MD  Encounter Date: 01/10/2016      PT End of Session - 01/10/16 1553    Visit Number 3   Number of Visits 12   Date for PT Re-Evaluation 02/09/16   Authorization Type BCBS   PT Start Time 1500   PT Stop Time 9381   PT Time Calculation (min) 48 min   Activity Tolerance Patient tolerated treatment well   Behavior During Therapy [facility] for tasks assessed/performed      No past medical history on file.  No past surgical history on file.  There were no vitals filed for this visit.      Subjective Assessment - 01/10/16 1508    Subjective "after last session I had no pain after the needling and felt much better" pt reported some soreness after doing her exercises but it isn't as bad today as it has been.    Currently in Pain? Yes   Pain Score 1    Pain Location Hip   Pain Orientation Right;Lateral;Posterior   Pain Descriptors / Indicators Aching   Pain Type Chronic pain                         [facility] Adult PT Treatment/Exercise - 01/10/16 1551    Knee/Hip Exercises: Stretches   ITB Stretch 2 reps;30 seconds   Piriformis Stretch 3 reps;30 seconds;Other (comment)  contract/ relax with 10 sec hold   Knee/Hip Exercises: Supine   Bridges with Clamshell AROM;Strengthening;Both;2 sets;15 reps  with green theraband   Manual Therapy   Soft tissue mobilization IASTM over the R piriformis/ glute medius following DN   Myofascial Release stretching/ rolling of the glute medius/piriformis   Other Manual Therapy tack and stretch of the R piriformis 2 x 10          Trigger Point Dry Needling - 01/10/16 1552    Consent Given? Yes   Education Handout Provided No  given previously   Muscles Treated Lower Body Piriformis;Gluteus minimus   Gluteus Minimus Response Twitch response elicited;Palpable increased muscle length  glute Medius   Piriformis Response Twitch response elicited;Palpable increased muscle length              PT Education - 01/10/16 1553    Education provided Yes   Education Details updated HEP   Person(s) Educated Patient   Methods Explanation   Comprehension Verbalized understanding          PT Short Term Goals - 01/06/16 1238    PT SHORT TERM GOAL #1   Title pt will be I with inital HEP (01/19/2016)   Time 3   Period Weeks   Status On-going           PT Long Term Goals - 12/29/15 1257    PT LONG TERM GOAL #1   Title pt will be I with all HEP as of last visit (02/09/2016)   Time 6   Period Weeks   Status New   PT LONG TERM GOAL #2   Title pt will improve R hip abductor/ extensor strength to >/= 4+/5 with </=1/10 pain to promote functional endurance (02/09/2016)   Time 6   Period Weeks   Status New   PT LONG TERM GOAL #  3   Title pt will demonstrate no palpale trigger points to in the R hip abductors/ piriformis to assist with pain relief of </=1/10 for funcitonal progression (02/09/2016)   Time 6   Period Weeks   Status New   PT LONG TERM GOAL #4   Title Pt will be able stand/ walk for >/= 45 minutes with </=1/10 to assist with personal goal of returning back to walking (02/09/2016   Time 6   Period Weeks   Status New               Plan - 01/10/16 1553    Clinical Impression Statement Mrs. Suniga stated she got some relief since the last session of dry needling. DN was performed on glute medius/ minimus and piriformis pt was monitored throughout treatment. follwoing DN, stretching and IASTM was performed which she reported relief and was able to perform exercises.   PT Next Visit Plan assess response Dry needling, IASTM, hip strengthening, modalites PRN   Consulted and Agree with Plan of Care Patient      Patient  will benefit from skilled therapeutic intervention in order to improve the following deficits and impairments:  Pain, Postural dysfunction, Improper body mechanics, Decreased endurance, Decreased activity tolerance, Decreased balance, Decreased strength, Hypermobility, Difficulty walking, Abnormal gait  Visit Diagnosis: Pain in right hip  Cramp and spasm  Muscle weakness (generalized)  Difficulty in walking, not elsewhere classified     Problem List Patient Active Problem List   Diagnosis Date Noted  . 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 Abnormality of gait 04/21/2015  . Pes planus of both feet 04/21/2015   Anna Sellers PT, DPT, LAT, ATC  01/10/2016  3:57 PM      [facility] [address] [place], [place], [address] Phone: [phone]   Fax:  [phone]  Name: Anna Sellers MRN: [id number] Date of Birth: 02-16-1976

## 2016-01-12 ENCOUNTER — Ambulatory Visit: Payer: BC Managed Care – PPO | Admitting: Physical Therapy

## 2016-01-12 DIAGNOSIS — M25551 Pain in right hip: Secondary | ICD-10-CM

## 2016-01-12 DIAGNOSIS — M6281 Muscle weakness (generalized): Secondary | ICD-10-CM

## 2016-01-12 DIAGNOSIS — R262 Difficulty in walking, not elsewhere classified: Secondary | ICD-10-CM

## 2016-01-12 DIAGNOSIS — R252 Cramp and spasm: Secondary | ICD-10-CM

## 2016-01-12 NOTE — Therapy (Signed)
[place], [place], [address] Phone: [phone]   Fax:  [phone]  Physical Therapy Treatment  Patient Details  Name: Anna Sellers MRN: [id number] Date of Birth: 1976-03-23 Referring Provider: Everlean Alstrom MD  Encounter Date: 01/12/2016      PT End of Session - 01/12/16 1259    Visit Number 4   Number of Visits 12   Date for PT Re-Evaluation 02/09/16   Authorization Type BCBS   PT Start Time 6720   PT Stop Time 1102   PT Time Calculation (min) 47 min   Activity Tolerance Patient tolerated treatment well   Behavior During Therapy [facility] for tasks assessed/performed      No past medical history on file.  No past surgical history on file.  There were no vitals filed for this visit.      Subjective Assessment - 01/12/16 1022    Subjective "some soreness after last session with the DN" pt reports getting a standing desk but needs to get shoes for it"   Currently in Pain? Yes   Pain Score 1    Pain Location Hip   Pain Orientation Right;Lateral   Pain Descriptors / Indicators Aching   Pain Type Chronic pain   Pain Onset More than a month ago   Pain Frequency Intermittent                         [facility] Adult PT Treatment/Exercise - 01/12/16 1102    Knee/Hip Exercises: Stretches   ITB Stretch 2 reps;30 seconds  standing 2 x 30 sec given as HEP   Piriformis Stretch 2 reps;30 seconds   Knee/Hip Exercises: Aerobic   Recumbent Bike L 4 x 5 min   Knee/Hip Exercises: Supine   Bridges with Clamshell AROM;Strengthening;Both;2 sets;15 reps   Other Supine Knee/Hip Exercises rolling on foam roller for piriformis and glute medius   Manual Therapy   Soft tissue mobilization DTM over glute medius/ piriformis   Myofascial Release stretching/ rolling of the glute medius/piriformis   Other Manual Therapy tack and stretch of the R piriformis 2 x 10          Trigger Point Dry Needling -  01/12/16 1036    Consent Given? Yes   Education Handout Provided No  given previously   Muscles Treated Lower Body Piriformis   Piriformis Response Twitch response elicited;Palpable increased muscle length              PT Education - 01/12/16 1259    Education provided Yes   Education Details updated HEP for ITB stretching in standing, educated how to used foam roll with bolster   Person(s) Educated Patient   Methods Explanation   Comprehension Verbalized understanding          PT Short Term Goals - 01/12/16 1301    PT SHORT TERM GOAL #1   Title pt will be I with inital HEP (01/19/2016)   Time 3   Period Weeks   Status Achieved           PT Long Term Goals - 12/29/15 1257    PT LONG TERM GOAL #1   Title pt will be I with all HEP as of last visit (02/09/2016)   Time 6   Period Weeks   Status New   PT LONG TERM GOAL #2   Title pt will improve R hip abductor/ extensor strength to >/= 4+/5 with </=1/10 pain  to promote functional endurance (02/09/2016)   Time 6   Period Weeks   Status New   PT LONG TERM GOAL #3   Title pt will demonstrate no palpale trigger points to in the R hip abductors/ piriformis to assist with pain relief of </=1/10 for funcitonal progression (02/09/2016)   Time 6   Period Weeks   Status New   PT LONG TERM GOAL #4   Title Pt will be able stand/ walk for >/= 45 minutes with </=1/10 to assist with personal goal of returning back to walking (02/09/2016   Time 6   Period Weeks   Status New               Plan - 01/12/16 1300    Clinical Impression Statement Novis reported some soreness since the last session but still states pain at 1/10. DN only performed on piriformis today, pt monitored during treatment. she reported relief and improved mobility following exercise and stretching.  She met her STG #1 today.    Rehab Potential Good   PT Next Visit Plan assess response Dry needling, IASTM, hip strengthening, modalites PRN   PT Home  Exercise Plan standing ITB stretch   Consulted and Agree with Plan of Care Patient      Patient will benefit from skilled therapeutic intervention in order to improve the following deficits and impairments:  Pain, Postural dysfunction, Improper body mechanics, Decreased endurance, Decreased activity tolerance, Decreased balance, Decreased strength, Hypermobility, Difficulty walking, Abnormal gait  Visit Diagnosis: Pain in right hip  Cramp and spasm  Muscle weakness (generalized)  Difficulty in walking, not elsewhere classified     Problem List Patient Active Problem List   Diagnosis Date Noted  . 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 Abnormality of gait 04/21/2015  . Pes planus of both feet 04/21/2015   [name] PT, DPT, LAT, ATC  01/12/2016  1:02 PM      [facility] [address] [place], [place], [address] Phone: [phone]   Fax:  [phone]  Name: Cordie Beazley MRN: [id number] Date of Birth: 1975-10-08

## 2016-01-17 ENCOUNTER — Ambulatory Visit: Payer: BC Managed Care – PPO | Attending: Sports Medicine | Admitting: Physical Therapy

## 2016-01-17 DIAGNOSIS — R252 Cramp and spasm: Secondary | ICD-10-CM | POA: Diagnosis present

## 2016-01-17 DIAGNOSIS — M6281 Muscle weakness (generalized): Secondary | ICD-10-CM

## 2016-01-17 DIAGNOSIS — R262 Difficulty in walking, not elsewhere classified: Secondary | ICD-10-CM | POA: Insufficient documentation

## 2016-01-17 DIAGNOSIS — M25551 Pain in right hip: Secondary | ICD-10-CM | POA: Diagnosis not present

## 2016-01-17 NOTE — Therapy (Signed)
[place], [place], [address] Phone: [phone]   Fax:  [phone]  Physical Therapy Treatment  Patient Details  Name: Anna Sellers MRN: [id number] Date of Birth: [birth year] Referring Provider: Everlean Alstrom MD  Encounter Date: 01/17/2016      PT End of Session - 01/17/16 1246    Visit Number 5   Number of Visits 12   Date for PT Re-Evaluation 02/09/16   Authorization Type BCBS   PT Start Time 5462   PT Stop Time 1232   PT Time Calculation (min) 47 min   Activity Tolerance Patient tolerated treatment well      No past medical history on file.  No past surgical history on file.  There were no vitals filed for this visit.      Subjective Assessment - 01/17/16 1147    Subjective " I am sore but I thinkit is due to doing the exercises" pt reports she can tell that she actvitate the glutes voluntarily"   Currently in Pain? Yes   Pain Score 2    Pain Location Hip   Pain Orientation Right;Lateral   Pain Descriptors / Indicators Sore   Pain Type Chronic pain   Pain Onset More than a month ago   Aggravating Factors  unknown   Pain Relieving Factors ibuprofen, DTM with ball                         [facility] Adult PT Treatment/Exercise - 01/17/16 1218    Ambulation/Gait   Gait Comments significant medial heel whip bil almost hitting opposite calf.   pt typically wears high heeled boots, but wore shoes today   Knee/Hip Exercises: Stretches   Hip Flexor Stretch 2 reps;30 seconds   ITB Stretch 3 reps;30 seconds   Piriformis Stretch 2 reps;30 seconds   Knee/Hip Exercises: Aerobic   Nustep L5 x 5 min  LE only   Knee/Hip Exercises: Standing   Wall Squat 2 sets;10 reps   Other Standing Knee Exercises lateral band walks 4 x 15 ft  with green theraband          Trigger Point Dry Needling - 01/17/16 1204    Consent Given? Yes   Education Handout Provided No  given previously   Muscles  Treated Lower Body Piriformis;Gluteus minimus  glute Medius              PT Education - 01/17/16 1246    Education provided Yes   Education Details updated HEP   Person(s) Educated Patient   Methods Explanation   Comprehension Verbalized understanding          PT Short Term Goals - 01/12/16 1301    PT SHORT TERM GOAL #1   Title pt will be I with inital HEP (01/19/2016)   Time 3   Period Weeks   Status Achieved           PT Long Term Goals - 01/17/16 1249    PT LONG TERM GOAL #1   Title pt will be I with all HEP as of last visit (02/09/2016)   Time 6   Period Weeks   Status On-going   PT LONG TERM GOAL #2   Title pt will improve R hip abductor/ extensor strength to >/= 4+/5 with </=1/10 pain to promote functional endurance (02/09/2016)   Time 6   Period Weeks   Status On-going   PT LONG TERM GOAL #3  Title pt will demonstrate no palpale trigger points to in the R hip abductors/ piriformis to assist with pain relief of </=1/10 for funcitonal progression (02/09/2016)   Time 6   Period Weeks   Status On-going   PT LONG TERM GOAL #4   Title Pt will be able stand/ walk for >/= 45 minutes with </=1/10 to assist with personal goal of returning back to walking (02/09/2016   Time 6   Period Weeks   Status On-going               Plan - 01/17/16 1247    Clinical Impression Statement Anna Sellers states she is feeling sore from the exercise but reports feeling like she can voluntarily contract her glute medius. Following DN and stretching/ DTM she reported relief of tightness. She was able to do all exercises without increased pain just report of sorneess.visual inspection of gait she exhibits increased medial heel whip bil, so provided updated HEP for hip flexor stretching. pt declined modalities post session.    PT Next Visit Plan assess response Dry needling, IASTM, hip strengthening, modalites PRN   PT Home Exercise Plan hip flexor stretching   Consulted and Agree with  Plan of Care Patient      Patient will benefit from skilled therapeutic intervention in order to improve the following deficits and impairments:  Pain, Postural dysfunction, Improper body mechanics, Decreased endurance, Decreased activity tolerance, Decreased balance, Decreased strength, Hypermobility, Difficulty walking, Abnormal gait  Visit Diagnosis: Pain in right hip  Cramp and spasm  Muscle weakness (generalized)  Difficulty in walking, not elsewhere classified     Problem List Patient Active Problem List   Diagnosis Date Noted  . 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 Abnormality of gait 04/21/2015  . Pes planus of both feet 04/21/2015   [name] PT, DPT, LAT, ATC  01/17/2016  12:56 PM      [place] [facility] [address] [place], [place], [address] Phone: [phone]   Fax:  [phone]  Name: Anna Sellers MRN: [id number] Date of Birth: December 18, 1975

## 2016-01-19 ENCOUNTER — Ambulatory Visit: Payer: BC Managed Care – PPO | Admitting: Physical Therapy

## 2016-01-19 DIAGNOSIS — R252 Cramp and spasm: Secondary | ICD-10-CM

## 2016-01-19 DIAGNOSIS — M6281 Muscle weakness (generalized): Secondary | ICD-10-CM

## 2016-01-19 DIAGNOSIS — R262 Difficulty in walking, not elsewhere classified: Secondary | ICD-10-CM

## 2016-01-19 DIAGNOSIS — M25551 Pain in right hip: Secondary | ICD-10-CM | POA: Diagnosis not present

## 2016-01-19 NOTE — Therapy (Signed)
[place], [place], [address] Phone: [phone]   Fax:  [phone]  Physical Therapy Treatment  Patient Details  Name: Anna Sellers MRN: [id number] Date of Birth: [birth year] Referring Provider: Everlean Alstrom MD  Encounter Date: 01/19/2016      PT End of Session - 01/19/16 1157    Visit Number 6   Number of Visits 12   Date for PT Re-Evaluation 02/09/16   Authorization Type BCBS   PT Start Time 1106   PT Stop Time 1146   PT Time Calculation (min) 40 min   Activity Tolerance Patient tolerated treatment well   Behavior During Therapy [facility] for tasks assessed/performed      No past medical history on file.  No past surgical history on file.  There were no vitals filed for this visit.      Subjective Assessment - 01/19/16 1109    Subjective "I am doing really well"   Currently in Pain? Yes   Pain Score 0-No pain   Pain Location Hip   Pain Orientation Right;Lateral   Pain Type Chronic pain   Pain Onset More than a month ago   Pain Frequency Intermittent                         [facility] Adult PT Treatment/Exercise - 01/19/16 1113    Self-Care   Self-Care Other Self-Care Comments   Other Self-Care Comments  stretching throughout the day to promote relief of tension, and to expect soreness with new exercises but to continue with HEP   Knee/Hip Exercises: Stretches   ITB Stretch 2 reps;30 seconds   Piriformis Stretch 30 seconds;2 reps   Knee/Hip Exercises: Aerobic   Elliptical L 8 x 5 min   Knee/Hip Exercises: Standing   Rebounder 3 x 10 SLS on the R   1 x with red ball, 1 x with yellow ball   Other Standing Knee Exercises Hip hikes 2 x 10 of 4 inch step  cues for proper form, to stand up straight during exercise   Knee/Hip Exercises: Seated   Other Seated Knee/Hip Exercises 1/2 kneeling balance on airex pad 2 x 30 sec perform bil  moderate postural sway bil                 PT Education - 01/19/16 1157    Education provided Yes   Education Details Updated HEP   Person(s) Educated Patient   Methods Explanation   Comprehension Verbalized understanding          PT Short Term Goals - 01/12/16 1301    PT SHORT TERM GOAL #1   Title pt will be I with inital HEP (01/19/2016)   Time 3   Period Weeks   Status Achieved           PT Long Term Goals - 01/17/16 1249    PT LONG TERM GOAL #1   Title pt will be I with all HEP as of last visit (02/09/2016)   Time 6   Period Weeks   Status On-going   PT LONG TERM GOAL #2   Title pt will improve R hip abductor/ extensor strength to >/= 4+/5 with </=1/10 pain to promote functional endurance (02/09/2016)   Time 6   Period Weeks   Status On-going   PT LONG TERM GOAL #3   Title pt will demonstrate no palpale trigger points to in the R hip abductors/ piriformis to  assist with pain relief of </=1/10 for funcitonal progression (02/09/2016)   Time 6   Period Weeks   Status On-going   PT LONG TERM GOAL #4   Title Pt will be able stand/ walk for >/= 45 minutes with </=1/10 to assist with personal goal of returning back to walking (02/09/2016   Time 6   Period Weeks   Status On-going               Plan - 01/19/16 1157    Clinical Impression Statement Mrs. Palinkas reports no pain today. due to no pain opted not ot peroform dry needling or manual and focus on balance and strengthening on the hips/ core. She was able to perform all exercises without report of pain and only required cues on proper form for proper mechanics.    PT Next Visit Plan DN  PRN, IASTM, hip strengthening, modalites PRN, balance traini   PT Home Exercise Plan hip hikes, kneeling balance, SLS balance   Consulted and Agree with Plan of Care Patient      Patient will benefit from skilled therapeutic intervention in order to improve the following deficits and impairments:  Pain, Postural dysfunction, Improper body mechanics, Decreased endurance,  Decreased activity tolerance, Decreased balance, Decreased strength, Hypermobility, Difficulty walking, Abnormal gait  Visit Diagnosis: Pain in right hip  Cramp and spasm  Muscle weakness (generalized)  Difficulty in walking, not elsewhere classified     Problem List Patient Active Problem List   Diagnosis Date Noted  . 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 Abnormality of gait 04/21/2015  . Pes planus of both feet 04/21/2015   [name] PT, DPT, LAT, ATC  01/19/2016  12:06 PM      [place] [facility] [address] [place], [place], [address] Phone: [phone]   Fax:  [phone]  Name: Anna Sellers MRN: [id number] Date of Birth: 1975-09-27

## 2016-01-24 ENCOUNTER — Ambulatory Visit: Payer: BC Managed Care – PPO | Admitting: Physical Therapy

## 2016-01-24 DIAGNOSIS — R252 Cramp and spasm: Secondary | ICD-10-CM

## 2016-01-24 DIAGNOSIS — M25551 Pain in right hip: Secondary | ICD-10-CM | POA: Diagnosis not present

## 2016-01-24 DIAGNOSIS — M6281 Muscle weakness (generalized): Secondary | ICD-10-CM

## 2016-01-24 DIAGNOSIS — R262 Difficulty in walking, not elsewhere classified: Secondary | ICD-10-CM

## 2016-01-24 NOTE — Therapy (Signed)
[place], [place], [address] Phone: [phone]   Fax:  [phone]  Physical Therapy Treatment  Patient Details  Name: Anna Sellers MRN: [id number] Date of Birth: September 11, 1976 Referring Provider: Everlean Alstrom MD  Encounter Date: 01/24/2016      PT End of Session - 01/24/16 1205    Visit Number 7   Number of Visits 12   Date for PT Re-Evaluation 02/09/16   Authorization Type BCBS   PT Start Time 1100   PT Stop Time 1158   PT Time Calculation (min) 58 min   Activity Tolerance Patient tolerated treatment well   Behavior During Therapy [facility] for tasks assessed/performed      No past medical history on file.  No past surgical history on file.  There were no vitals filed for this visit.      Subjective Assessment - 01/24/16 1107    Subjective "i've been doing more packing and moving around and cleaning and have noticed that its been sore but not as bad as it used to be"    Currently in Pain? Yes   Pain Score 2    Pain Orientation Right   Pain Descriptors / Indicators Tightness   Pain Onset More than a month ago   Pain Frequency Intermittent   Aggravating Factors  unknown   Pain Relieving Factors ibuprfen, strengthening                         [facility] Adult PT Treatment/Exercise - 01/24/16 1158    Knee/Hip Exercises: Stretches   ITB Stretch 2 reps;30 seconds   Piriformis Stretch 2 reps;30 seconds   Knee/Hip Exercises: Aerobic   Elliptical L 8 x 5 min   Knee/Hip Exercises: Seated   Other Seated Knee/Hip Exercises external rotation 3 x 10  with green theraband   Knee/Hip Exercises: Sidelying   Hip ABduction AROM;Strengthening;Right;10 reps;2 sets  in L sidelying in a ellipitcal pattern only   Moist Heat Therapy   Number Minutes Moist Heat 10 Minutes   Moist Heat Location Hip  R hip in L sidelying   Manual Therapy   Other Manual Therapy tack and stretch of the R piriformis 1 x 15           Trigger Point Dry Needling - 01/24/16 1129    Consent Given? Yes   Education Handout Provided No  given previously   Muscles Treated Lower Body Piriformis   Piriformis Response Twitch response elicited;Palpable increased muscle length              PT Education - 01/24/16 1205    Education provided Yes   Education Details Updated HEP, discussed progress    Person(s) Educated Patient   Methods Explanation   Comprehension Verbalized understanding          PT Short Term Goals - 01/12/16 1301    PT SHORT TERM GOAL #1   Title pt will be I with inital HEP (01/19/2016)   Time 3   Period Weeks   Status Achieved           PT Long Term Goals - 01/17/16 1249    PT LONG TERM GOAL #1   Title pt will be I with all HEP as of last visit (02/09/2016)   Time 6   Period Weeks   Status On-going   PT LONG TERM GOAL #2   Title pt will improve R hip abductor/ extensor strength  to >/= 4+/5 with </=1/10 pain to promote functional endurance (02/09/2016)   Time 6   Period Weeks   Status On-going   PT LONG TERM GOAL #3   Title pt will demonstrate no palpale trigger points to in the R hip abductors/ piriformis to assist with pain relief of </=1/10 for funcitonal progression (02/09/2016)   Time 6   Period Weeks   Status On-going   PT LONG TERM GOAL #4   Title Pt will be able stand/ walk for >/= 45 minutes with </=1/10 to assist with personal goal of returning back to walking (02/09/2016   Time 6   Period Weeks   Status On-going               Plan - 01/24/16 1206    Clinical Impression Statement Anna Sellers continues to make progress with therapy reporting only tightness in the hip even after increased activity at home involving packing, and lifting/ carrying activities. Following DN of the piriformis she reported relief of tightness with some sorness, pt was monitored during treatment. she was able to perform exercises with minimal report of soreness, especially with  sidelying elliptical hip abduction. Utilized MHP post session to calm down soreness post session.    PT Next Visit Plan DN  PRN, IASTM, hip strengthening, modalites PRN, balance trainining, lunges with band around knee, assess strength, goals   Consulted and Agree with Plan of Care Patient      Patient will benefit from skilled therapeutic intervention in order to improve the following deficits and impairments:  Pain, Postural dysfunction, Improper body mechanics, Decreased endurance, Decreased activity tolerance, Decreased balance, Decreased strength, Hypermobility, Difficulty walking, Abnormal gait  Visit Diagnosis: Pain in right hip  Cramp and spasm  Muscle weakness (generalized)  Difficulty in walking, not elsewhere classified     Problem List Patient Active Problem List   Diagnosis Date Noted  . 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 Abnormality of gait 04/21/2015  . Pes planus of both feet 04/21/2015   [name] PT, DPT, LAT, ATC  01/24/2016  12:10 PM      [facility] [address] [place], [place], [address] Phone: [phone]   Fax:  [phone]  Name: Anna Sellers MRN: [id number] Date of Birth: 07-05-1976

## 2016-01-26 ENCOUNTER — Encounter: Payer: BC Managed Care – PPO | Admitting: Physical Therapy

## 2016-01-31 ENCOUNTER — Ambulatory Visit: Payer: BC Managed Care – PPO | Admitting: Physical Therapy

## 2016-01-31 DIAGNOSIS — R262 Difficulty in walking, not elsewhere classified: Secondary | ICD-10-CM

## 2016-01-31 DIAGNOSIS — M6281 Muscle weakness (generalized): Secondary | ICD-10-CM

## 2016-01-31 DIAGNOSIS — M25551 Pain in right hip: Secondary | ICD-10-CM | POA: Diagnosis not present

## 2016-01-31 DIAGNOSIS — R252 Cramp and spasm: Secondary | ICD-10-CM

## 2016-01-31 NOTE — Therapy (Signed)
[place], [place], [address] Phone: [phone]   Fax:  [phone]  Physical Therapy Treatment  Patient Details  Name: Anna Sellers MRN: [id number] Date of Birth: [birth year] Referring Provider: Everlean Alstrom MD  Encounter Date: 01/31/2016      PT End of Session - 01/31/16 1237    Visit Number 8   Number of Visits 12   Date for PT Re-Evaluation 02/09/16   Authorization Type BCBS   PT Start Time 1146   PT Stop Time 1232   PT Time Calculation (min) 46 min   Activity Tolerance Patient tolerated treatment well   Behavior During Therapy Anna Sellers for tasks assessed/performed      No past medical history on file.  No past surgical history on file.  There were no vitals filed for this visit.      Subjective Assessment - 01/31/16 1144    Subjective "I am sore today, more likely from the flight back from [place]"    Currently in Pain? Yes   Pain Score 3    Pain Location Hip   Pain Orientation Right   Pain Descriptors / Indicators Tightness   Pain Type Chronic pain   Pain Onset More than a month ago   Aggravating Factors  The flight back    Pain Relieving Factors Dn                          [facility] Adult PT Treatment/Exercise - 01/31/16 0001    Knee/Hip Exercises: Stretches   ITB Stretch 2 reps;30 seconds   Piriformis Stretch 2 reps;30 seconds   Modalities   Modalities Ultrasound   Ultrasound   Ultrasound Location R piriformis   Ultrasound Parameters cont, 2.0 w/cm2 x 8 min   Ultrasound Goals Other (Comment);Pain  elongation of muscle   Manual Therapy   Soft tissue mobilization IASTM over piriformis   Other Manual Therapy tack and stretch of the R piriformis 1 x 15                PT Education - 01/31/16 1242    Education provided Yes   Education Details benefits of [place] and trial to see for promoting relief of tension   Person(s) Educated Patient   Methods Explanation    Comprehension Verbalized understanding          PT Short Term Goals - 01/12/16 1301    PT SHORT TERM GOAL #1   Title pt will be I with inital HEP (01/19/2016)   Time 3   Period Weeks   Status Achieved           PT Long Term Goals - 01/17/16 1249    PT LONG TERM GOAL #1   Title pt will be I with all HEP as of last visit (02/09/2016)   Time 6   Period Weeks   Status On-going   PT LONG TERM GOAL #2   Title pt will improve R hip abductor/ extensor strength to >/= 4+/5 with </=1/10 pain to promote functional endurance (02/09/2016)   Time 6   Period Weeks   Status On-going   PT LONG TERM GOAL #3   Title pt will demonstrate no palpale trigger points to in the R hip abductors/ piriformis to assist with pain relief of </=1/10 for funcitonal progression (02/09/2016)   Time 6   Period Weeks   Status On-going   PT LONG TERM GOAL #4  Title Pt will be able stand/ walk for >/= 45 minutes with </=1/10 to assist with personal goal of returning back to walking (02/09/2016   Time 6   Period Weeks   Status On-going               Plan - 01/31/16 1237    Clinical Impression Statement [name] reported increased soreness today due to travel to [place] with increased prolonged sitting. DN was performed on her piriformis on the R; pt was monitored throughout treatment. Following IASTM and stretching she reported pain dropped to a 2/10.    PT Next Visit Plan assess respons to [place], DN  PRN, IASTM, hip strengthening, modalites PRN, balance trainining, lunges with band around knee, assess strength, goals, review HEP   Consulted and Agree with Plan of Care Patient      Patient will benefit from skilled therapeutic intervention in order to improve the following deficits and impairments:  Pain, Postural dysfunction, Improper body mechanics, Decreased endurance, Decreased activity tolerance, Decreased balance, Decreased strength, Hypermobility, Difficulty walking, Abnormal gait  Visit  Diagnosis: Pain in right hip  Cramp and spasm  Muscle weakness (generalized)  Difficulty in walking, not elsewhere classified     Problem List Patient Active Problem List   Diagnosis Date Noted  . 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 Abnormality of gait 04/21/2015  . Pes planus of both feet 04/21/2015   [name] PT, DPT, LAT, ATC  01/31/2016  12:43 PM      [place] [facility] [address] [place], [place], [address] Phone: [phone]   Fax:  [phone]  Name: Anna Sellers MRN: [id number] Date of Birth: 01/28/1976

## 2016-02-02 ENCOUNTER — Ambulatory Visit: Payer: BC Managed Care – PPO | Admitting: Family Medicine

## 2016-02-02 ENCOUNTER — Ambulatory Visit: Payer: BC Managed Care – PPO | Admitting: Physical Therapy

## 2016-02-02 DIAGNOSIS — M6281 Muscle weakness (generalized): Secondary | ICD-10-CM

## 2016-02-02 DIAGNOSIS — M25551 Pain in right hip: Secondary | ICD-10-CM | POA: Diagnosis not present

## 2016-02-02 DIAGNOSIS — R262 Difficulty in walking, not elsewhere classified: Secondary | ICD-10-CM

## 2016-02-02 DIAGNOSIS — R252 Cramp and spasm: Secondary | ICD-10-CM

## 2016-02-02 NOTE — Therapy (Signed)
[place], [place], [address] Phone: [phone]   Fax:  [phone]  Physical Therapy Treatment  Patient Details  Name: Anna Sellers MRN: [id number] Date of Birth: 1976/04/18 Referring Provider: Everlean Alstrom MD  Encounter Date: 02/02/2016      PT End of Session - 02/02/16 1239    Visit Number 9   Number of Visits 12   Date for PT Re-Evaluation 02/09/16   Authorization Type BCBS   PT Start Time 9794   PT Stop Time 1233   PT Time Calculation (min) 48 min   Activity Tolerance Patient tolerated treatment well   Behavior During Therapy [facility] for tasks assessed/performed      No past medical history on file.  No past surgical history on file.  There were no vitals filed for this visit.      Subjective Assessment - 02/02/16 1148    Subjective "the R hip is feeling good the L hip is feeling sore today from doing her exercise"   Currently in Pain? Yes   Pain Score 1    Pain Location Hip   Pain Orientation Right   Pain Descriptors / Indicators Tightness   Pain Type Chronic pain   Pain Onset More than a month ago   Pain Frequency Intermittent   Aggravating Factors  nothing            [facility] PT Assessment - 02/02/16 1158    Strength   Right Hip Flexion 4/5   Right Hip Extension 4/5   Right Hip External Rotation  4/5   Right Hip ABduction 4/5   Left Hip Flexion 4/5                     [facility] Adult PT Treatment/Exercise - 02/02/16 0001    Knee/Hip Exercises: Stretches   ITB Stretch 2 reps;30 seconds   Piriformis Stretch 2 reps;30 seconds   Knee/Hip Exercises: Machines for Strengthening   Total Gym Leg Press 3 x 10, 1 set with 45#, 2 x with 65#  cues for 3 sec controlled eccentric lowering on last set   Knee/Hip Exercises: Standing   Other Standing Knee Exercises Hip hikes 2 x 10 of 6 inch step   Knee/Hip Exercises: Seated   Other Seated Knee/Hip Exercises 1/2 kneeling on foam pad  with lift/ chop motion with red theraband  cues for form   Knee/Hip Exercises: Sidelying   Other Sidelying Knee/Hip Exercises external rotation of hip with 3# 1 x 10  pt reported diffiuclty performing more than 10 reps                PT Education - 02/02/16 1239    Education provided Yes   Education Details reviewed and reprinted all exercises   Person(s) Educated Patient   Methods Explanation   Comprehension Verbalized understanding          PT Short Term Goals - 01/12/16 1301    PT SHORT TERM GOAL #1   Title pt will be I with inital HEP (01/19/2016)   Time 3   Period Weeks   Status Achieved           PT Long Term Goals - 02/02/16 1202    PT LONG TERM GOAL #1   Title pt will be I with all HEP as of last visit (02/09/2016)   Time 6   Period Weeks   Status On-going   PT LONG TERM GOAL #2  Title pt will improve R hip abductor/ extensor strength to >/= 4+/5 with </=1/10 pain to promote functional endurance (02/09/2016)   Baseline 4/5 strength with 0/10 pain    Time 6   Period Weeks   Status Partially Met   PT LONG TERM GOAL #3   Title pt will demonstrate no palpale trigger points to in the R hip abductors/ piriformis to assist with pain relief of </=1/10 for funcitonal progression (02/09/2016)   Baseline some trigger points palapable but as irritable.    Time 6   Period Weeks   Status Partially Met   PT LONG TERM GOAL #4   Title Pt will be able stand/ walk for >/= 45 minutes with </=1/10 to assist with personal goal of returning back to walking (02/09/2016   Time 6   Period Weeks   Status Achieved               Plan - 02/02/16 1240    Clinical Impression Statement Raeley continues to make progress with therapy with decrease pain in the R hip during testing. She has partially met LTG #2,3 and met LTG #4. continuing to work toward all remaining goals with strengthing of the hip abductor/ extensors and promote endurance and walking tolerance.    PT Next  Visit Plan  DN  PRN, IASTM, hip strengthening, modalites PRN, balance trainining, lunges with band around knee   PT Home Exercise Plan HEP review, added side lying hip external rotation   Consulted and Agree with Plan of Care Patient      Patient will benefit from skilled therapeutic intervention in order to improve the following deficits and impairments:  Pain, Postural dysfunction, Improper body mechanics, Decreased endurance, Decreased activity tolerance, Decreased balance, Decreased strength, Hypermobility, Difficulty walking, Abnormal gait  Visit Diagnosis: Pain in right hip  Cramp and spasm  Muscle weakness (generalized)  Difficulty in walking, not elsewhere classified     Problem List Patient Active Problem List   Diagnosis Date Noted  . 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 Abnormality of gait 04/21/2015  . Pes planus of both feet 04/21/2015   [name] PT, DPT, LAT, ATC  02/02/2016  12:43 PM      [place] [facility] [address] [place], [place], [address] Phone: [phone]   Fax:  [phone]  Name: Anna Sellers MRN: [id number] Date of Birth: [birth year]

## 2016-02-02 NOTE — Patient Instructions (Addendum)
      Hip circles in elliptical form 2 x 10 forward / backward in side lying.

## 2016-02-07 ENCOUNTER — Encounter: Payer: BC Managed Care – PPO | Admitting: Physical Therapy

## 2016-02-09 ENCOUNTER — Ambulatory Visit: Payer: BC Managed Care – PPO | Admitting: Physical Therapy

## 2016-02-09 DIAGNOSIS — M25551 Pain in right hip: Secondary | ICD-10-CM | POA: Diagnosis not present

## 2016-02-09 DIAGNOSIS — R252 Cramp and spasm: Secondary | ICD-10-CM

## 2016-02-09 DIAGNOSIS — M6281 Muscle weakness (generalized): Secondary | ICD-10-CM

## 2016-02-09 DIAGNOSIS — R262 Difficulty in walking, not elsewhere classified: Secondary | ICD-10-CM

## 2016-02-09 NOTE — Patient Instructions (Signed)

## 2016-02-09 NOTE — Therapy (Signed)
[place], [place], [address] Phone: [phone]   Fax:  [phone]  Physical Therapy Treatment / Re-certification   Patient Details  Name: Anna Sellers MRN: [id number] Date of Birth: 1975-09-22 Referring Provider: Everlean Alstrom MD  Encounter Date: 02/09/2016      PT End of Session - 02/09/16 1152    Visit Number 10   Number of Visits 13   Date for PT Re-Evaluation 03/08/16   Authorization Type BCBS   PT Start Time 4259   PT Stop Time 1233   PT Time Calculation (min) 48 min   Activity Tolerance Patient tolerated treatment well   Behavior During Therapy [facility] for tasks assessed/performed      No past medical history on file.  No past surgical history on file.  There were no vitals filed for this visit.      Subjective Assessment - 02/09/16 1148    Subjective "feeling really good today in the hip"    Currently in Pain? Yes   Pain Score --  pt reports its at a .5   Pain Location Hip   Pain Orientation Right   Pain Descriptors / Indicators Tightness   Pain Type Chronic pain   Pain Frequency Occasional   Aggravating Factors  prolonged walking/ and sitting   Pain Relieving Factors stretching and exercise            [facility] PT Assessment - 02/09/16 1229    Strength   Right Hip Flexion 4/5   Right Hip Extension 4/5   Right Hip External Rotation  4/5   Right Hip Internal Rotation 4/5   Right Hip ABduction 4+/5                     [facility] Adult PT Treatment/Exercise - 02/09/16 1205    Lumbar Exercises: Prone   Opposite Arm/Leg Raise Right arm/Left leg;Left arm/Right leg;10 reps;1 second  yard stick across back for form   Knee/Hip Exercises: Stretches   ITB Stretch 2 reps;30 seconds   Piriformis Stretch 3 reps;30 seconds   Knee/Hip Exercises: Aerobic   Elliptical L 10 with L 10 incline x 6 min   Knee/Hip Exercises: Standing   Forward Lunges Right;2 sets;10 reps  R leading with  Medial pull with Green theraband   Knee/Hip Exercises: Supine   Other Supine Knee/Hip Exercises dead bug 5 x 10 sec hold   Other Supine Knee/Hip Exercises supine marcing on bolster 2 x 10   verbal cues to keep core tight for stability   Knee/Hip Exercises: Sidelying   Other Sidelying Knee/Hip Exercises external rotation of hip with 3# 1 x 10                PT Education - 02/09/16 1241    Education provided Yes   Education Details posture education for proper spinal curves and lifting/ carrying mechanics, benefits of core strengthening for maintenance of posture, updated HEP with reps/ sets and form.    Person(s) Educated Patient   Methods Explanation;Handout;Demonstration   Comprehension Verbalized understanding;Returned demonstration          PT Short Term Goals - 01/12/16 1301    PT SHORT TERM GOAL #1   Title pt will be I with inital HEP (01/19/2016)   Time 3   Period Weeks   Status Achieved           PT Long Term Goals - 02/09/16 1240    PT LONG TERM  GOAL #1   Title pt will be I with all HEP as of last visit (02/09/2016)   Time 6   Period Weeks   Status On-going   PT LONG TERM GOAL #2   Title pt will improve R hip abductor/ extensor strength to >/= 4+/5 with </=1/10 pain to promote functional endurance (02/09/2016)   Time 6   Period Weeks   Status Partially Met   PT LONG TERM GOAL #3   Title pt will demonstrate no palpale trigger points to in the R hip abductors/ piriformis to assist with pain relief of </=1/10 for funcitonal progression (02/09/2016)   Time 6   Period Weeks   Status Partially Met   PT LONG TERM GOAL #4   Title Pt will be able stand/ walk for >/= 45 minutes with </=1/10 to assist with personal goal of returning back to walking (02/09/2016   Time 6   Period Weeks   Status Achieved               Plan - 02/09/16 1236    Clinical Impression Statement [name] continues to progress with PT improving her strength in the hips since  evaluation and reporting decreased pain. Focused todays session on strengthening of both hips and core which she demonstrated fatigue with core exercises requiring verbal and tactile cues for proper form. she reported some soreness in the R lateral  hip following ellipitical and lunges with medial pull of the theraband but stated it was tolerable. Plan to conitnue with current POC progressing to 1 x a week for the next 3 weeks to work toward independent exercise and remaining goals.    Rehab Potential Good   PT Frequency 1x / week   PT Duration 3 weeks   PT Treatment/Interventions ADLs/Self Care Home Management;Cryotherapy;Electrical Stimulation;Moist Heat;Therapeutic exercise;Therapeutic activities;Iontophoresis 58m/ml Dexamethasone;Manual techniques;Dry needling;Passive range of motion;Patient/family education;Ultrasound;Taping;Balance training;Stair training   PT Next Visit Plan  DN  PRN, IASTM, hip strengthening, modalites PRN, balance trainining, lunges with band around knee, focus on eccentric and endurance    PT Home Exercise Plan dead bug, bird dogs, lunge with medial pull of band on R, marching while on bolster   Consulted and Agree with Plan of Care Patient      Patient will benefit from skilled therapeutic intervention in order to improve the following deficits and impairments:  Pain, Postural dysfunction, Improper body mechanics, Decreased endurance, Decreased activity tolerance, Decreased balance, Decreased strength, Hypermobility, Difficulty walking, Abnormal gait  Visit Diagnosis: Pain in right hip - Plan: PT plan of care cert/re-cert  Cramp and spasm - Plan: PT plan of care cert/re-cert  Muscle weakness (generalized) - Plan: PT plan of care cert/re-cert  Difficulty in walking, not elsewhere classified - Plan: PT plan of care cert/re-cert     Problem List Patient Active Problem List   Diagnosis Date Noted  . 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 Abnormality of  gait 04/21/2015  . Pes planus of both feet 04/21/2015   KStarr LakePT, DPT, LAT, ATC  02/09/2016  12:48 PM      [facility] [address]Phone: [phone]  Fax:  [phone] Name: Anna MargerumMRN: [id number] of Birth: [birth year]

## 2016-02-16 ENCOUNTER — Ambulatory Visit: Payer: BC Managed Care – PPO | Admitting: Physical Therapy

## 2016-02-16 DIAGNOSIS — R252 Cramp and spasm: Secondary | ICD-10-CM

## 2016-02-16 DIAGNOSIS — R262 Difficulty in walking, not elsewhere classified: Secondary | ICD-10-CM

## 2016-02-16 DIAGNOSIS — M6281 Muscle weakness (generalized): Secondary | ICD-10-CM

## 2016-02-16 DIAGNOSIS — M25551 Pain in right hip: Secondary | ICD-10-CM

## 2016-02-16 NOTE — Therapy (Signed)
[place], [place], [address] Phone: [phone]   Fax:  [phone]  Physical Therapy Treatment  Patient Details  Name: Anna Sellers MRN: [id number] Date of Birth: [birth year] Referring Provider: Everlean Alstrom MD  Encounter Date: 02/16/2016      PT End of Session - 02/16/16 1242    Visit Number 11   Number of Visits 13   Date for PT Re-Evaluation 03/08/16   Authorization Type BCBS   PT Start Time 1148   PT Stop Time 1232   PT Time Calculation (min) 44 min   Activity Tolerance Patient tolerated treatment well   Behavior During Therapy [facility] for tasks assessed/performed      No past medical history on file.  No past surgical history on file.  There were no vitals filed for this visit.      Subjective Assessment - 02/16/16 1146    Subjective "I feel like overall I have been doing well"   Currently in Pain? No/denies                         [facility] Adult PT Treatment/Exercise - 02/16/16 0001    Lumbar Exercises: Prone   Other Prone Lumbar Exercises plank 4 x 10 sec hold   verbal cues for form   Knee/Hip Exercises: Stretches   ITB Stretch 2 reps;30 seconds   Piriformis Stretch 2 reps;30 seconds  hip flexon/IR on table   Knee/Hip Exercises: Aerobic   Stepper L 3 x 8 min   Knee/Hip Exercises: Machines for Strengthening   Hip Cybex hip extension/ abduction 2 x 15 performed bil  37.5#   Knee/Hip Exercises: Standing   Forward Lunges Right;2 sets;10 reps;Other (comment)   Forward Lunges Limitations R leading with black band pulling medially pt resisting to work on strengthing glute med   Rebounder 3 x 15 SLS on the R with yellow ball   Other Standing Knee Exercises Hip hikes 2 x 20 of 6 inch step                PT Education - 02/16/16 1241    Education provided Yes   Education Details discussed progress and that if everthing is going well that next visit may be last visit.  only need to perform planks no longer than 10 seconds. continue to promote quality over quantity exercise.    Person(s) Educated Patient   Methods Explanation   Comprehension Verbalized understanding          PT Short Term Goals - 01/12/16 1301    PT SHORT TERM GOAL #1   Title pt will be I with inital HEP (01/19/2016)   Time 3   Period Weeks   Status Achieved           PT Long Term Goals - 02/09/16 1240    PT LONG TERM GOAL #1   Title pt will be I with all HEP as of last visit (02/09/2016)   Time 6   Period Weeks   Status On-going   PT LONG TERM GOAL #2   Title pt will improve R hip abductor/ extensor strength to >/= 4+/5 with </=1/10 pain to promote functional endurance (02/09/2016)   Time 6   Period Weeks   Status Partially Met   PT LONG TERM GOAL #3   Title pt will demonstrate no palpale trigger points to in the R hip abductors/ piriformis to assist with pain relief of </=1/10  for funcitonal progression (02/09/2016)   Time 6   Period Weeks   Status Partially Met   PT LONG TERM GOAL #4   Title Pt will be able stand/ walk for >/= 45 minutes with </=1/10 to assist with personal goal of returning back to walking (02/09/2016   Time 6   Period Weeks   Status Achieved               Plan - 02/16/16 1243    Clinical Impression Statement Mrs. Washington continues to make progress with therapy and focused todays session on strengthening with focus on increasing reps/ sets for endurance. she reported no pain or issues following todays session. Plan to cancel next scheduled appointment and assess how she is doing in one week and possibly discharge from there.    PT Next Visit Plan assess HEP, goals, HEP, D/C?   Consulted and Agree with Plan of Care Patient      Patient will benefit from skilled therapeutic intervention in order to improve the following deficits and impairments:  Pain, Postural dysfunction, Improper body mechanics, Decreased endurance, Decreased activity  tolerance, Decreased balance, Decreased strength, Hypermobility, Difficulty walking, Abnormal gait  Visit Diagnosis: Pain in right hip  Cramp and spasm  Muscle weakness (generalized)  Difficulty in walking, not elsewhere classified     Problem List Patient Active Problem List   Diagnosis Date Noted  . 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 Abnormality of gait 04/21/2015  . Pes planus of both feet 04/21/2015   [name] PT, DPT, LAT, ATC  02/16/2016  12:50 PM      [facility] [address] [place], [place], [address] Phone: [phone]   Fax:  [phone]  Name: Anna Sellers MRN: [id number] Date of Birth: 1976-09-10

## 2016-02-23 ENCOUNTER — Encounter: Payer: BC Managed Care – PPO | Admitting: Physical Therapy

## 2016-03-01 ENCOUNTER — Ambulatory Visit: Payer: BC Managed Care – PPO | Attending: Sports Medicine | Admitting: Physical Therapy

## 2016-03-01 DIAGNOSIS — M6281 Muscle weakness (generalized): Secondary | ICD-10-CM | POA: Insufficient documentation

## 2016-03-01 DIAGNOSIS — R252 Cramp and spasm: Secondary | ICD-10-CM | POA: Insufficient documentation

## 2016-03-01 DIAGNOSIS — M25551 Pain in right hip: Secondary | ICD-10-CM | POA: Diagnosis not present

## 2016-03-01 NOTE — Therapy (Signed)
[place], [place], [address] Phone: [phone]   Fax:  [phone]  Physical Therapy Treatment / discharge Note  Patient Details  Name: Anna Sellers MRN: [id number] Date of Birth: September 25, 1975 Referring Provider: Everlean Alstrom MD  Encounter Date: 03/01/2016      PT End of Session - 03/01/16 1149    Visit Number 12   Number of Visits 13   Date for PT Re-Evaluation 03/08/16   Authorization Type [employer]   PT Start Time 1100   PT Stop Time 1145   PT Time Calculation (min) 45 min   Activity Tolerance Patient tolerated treatment well   Behavior During Therapy [facility] for tasks assessed/performed      No past medical history on file.  No past surgical history on file.  There were no vitals filed for this visit.      Subjective Assessment - 03/01/16 1102    Subjective "its been a challengeing 2 weeks but its a combingation of moving out and doing more exercises at the gym"   Currently in Pain? Yes   Pain Score 3    Pain Location Hip   Pain Orientation Right   Pain Descriptors / Indicators Sore;Tightness   Pain Type Chronic pain   Pain Onset More than a month ago   Pain Frequency Occasional            [facility] PT Assessment - 03/01/16 1137    Strength   Right Hip Flexion 4/5   Right Hip Extension 4+/5   Right Hip External Rotation  4+/5   Right Hip Internal Rotation 4+/5                     [facility] Adult PT Treatment/Exercise - 03/01/16 0001    Knee/Hip Exercises: Stretches   ITB Stretch 2 reps;30 seconds   Piriformis Stretch 2 reps;30 seconds   Knee/Hip Exercises: Aerobic   Stepper L4 x 8 min   Knee/Hip Exercises: Standing   Hip Abduction AROM;Stengthening;2 sets;Knee straight  with blue theraband cues for 3 sec eccentric control   Functional Squat 3 sets;10 reps  with green theraband   Functional Squat Limitations 2 inch step beneath heels to facilitate proper squat form                  PT Education - 03/01/16 1148    Education provided Yes   Education Details reviewed HEP program increasing reps and sets to work on endurance. manual trigger point release techniques using theracane and where to purchase a theracane.   Person(s) Educated Patient   Methods Explanation   Comprehension Verbalized understanding          PT Short Term Goals - 01/12/16 1301    PT SHORT TERM GOAL #1   Title pt will be I with inital HEP (01/19/2016)   Time 3   Period Weeks   Status Achieved           PT Long Term Goals - 03/01/16 1140    PT LONG TERM GOAL #1   Title pt will be I with all HEP as of last visit (02/09/2016)   Time 6   Period Weeks   Status Achieved   PT LONG TERM GOAL #2   Title pt will improve R hip abductor/ extensor strength to >/= 4+/5 with </=1/10 pain to promote functional endurance (02/09/2016)   Time 6   Period Weeks   Status Partially Met  PT LONG TERM GOAL #3   Title pt will demonstrate no palpale trigger points to in the R hip abductors/ piriformis to assist with pain relief of </=1/10 for funcitonal progression (02/09/2016)   Time 6   Period Weeks   Status Achieved   PT LONG TERM GOAL #4   Title Pt will be able stand/ walk for >/= 45 minutes with </=1/10 to assist with personal goal of returning back to walking (02/09/2016   Time 6   Period Weeks   Status Achieved               Plan - 03/01/16 1153    Clinical Impression Statement Anna Sellers has improve her hip strength and reported some soreness had done more walking/hiking over the last week and has been prepping for moving. following exercises and stretches she reported decresaed soreness. she met  goals except for partially meeting LTG #2. pt reports she is able to maintain and progress her current level of function independently and will be discharged from PT today.    PT Next Visit Plan Discharge   PT Home Exercise Plan HEP review   Consulted and Agree with Plan of  Care Patient      Patient will benefit from skilled therapeutic intervention in order to improve the following deficits and impairments:  Pain, Postural dysfunction, Improper body mechanics, Decreased endurance, Decreased activity tolerance, Decreased balance, Decreased strength, Hypermobility, Difficulty walking, Abnormal gait  Visit Diagnosis: Pain in right hip  Cramp and spasm  Muscle weakness (generalized)     Problem List Patient Active Problem List   Diagnosis Date Noted  . 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 Abnormality of gait 04/21/2015  . Pes planus of both feet 04/21/2015   Jorell Agne PT, DPT, LAT, ATC  03/01/2016  12:02 PM      [facility] [address] [place], [place], [address] Phone: [phone]   Fax:  [phone]  Name: Anna Sellers MRN: [id number] Date of Birth: July 08, 1976   PHYSICAL THERAPY DISCHARGE SUMMARY  Visits from Start of Care: 12  Current functional level related to goals / functional outcomes: See goals   Remaining deficits: Intermittent soreness with R lateral hip with prolonged walking and standing, or prolonged hiking.    Education / Equipment: HEP, theraband, posture education, eccentric control  Plan: Patient agrees to discharge.  Patient goals were partially met. Patient is being discharged due to meeting the stated rehab goals.  ?????

## 2016-03-29 ENCOUNTER — Other Ambulatory Visit: Payer: Self-pay | Admitting: *Deleted

## 2016-03-29 MED ORDER — NORETHIN ACE-ETH ESTRAD-FE 1-20 MG-MCG PO TABS: 1.0000 | ORAL_TABLET | Freq: Every day | ORAL | Status: AC

## 2016-03-29 NOTE — Telephone Encounter (Signed)
Medication refill request: Junel  Last AEX:  05/03/15 Dr. Quincy Simmonds  Next AEX: [place] of [place]  Last MMG (if hormonal medication request): 06/18/15 [email or web address]:neg Refill authorized: 05/03/15 #3packs/3R.   Patient requesting 3 month supply until she gets a new doctor.

## 2016-03-31 ENCOUNTER — Other Ambulatory Visit: Payer: Self-pay | Admitting: Obstetrics and Gynecology

## 2016-04-03 ENCOUNTER — Other Ambulatory Visit: Payer: Self-pay | Admitting: Obstetrics and Gynecology

## 2016-04-03 NOTE — Telephone Encounter (Signed)
Medication refill request: JUNEL FE 1/20 [facility] Last AEX:  05/03/15 Dr. Quincy Simmonds Next AEX: [place] Last MMG (if hormonal medication request): 06/18/15 [email or web address] negative Refill authorized: already done 03/29/16

## 2016-04-04 NOTE — Telephone Encounter (Signed)
Medication refill request: JUNEL FE 1/20 [facility] Last AEX:  05/03/15 Dr. Quincy Simmonds Next AEX: [place] Last MMG (if hormonal medication request): 06/18/15 [email or web address] neg Refill authorized: already refilled

## 2017-05-13 IMAGING — RF DG FLUORO GUIDE SPINAL/SI JT INJ*R*
1 series · 1 of 1 positions shown · non-contrast
Comparison: none

CLINICAL DATA: RIGHT Sacroiliac joint dysfunction.

[Series 1: dg fl guide spinal/si jt inj right · 1 of 1 slices shown]
[im 1/1]
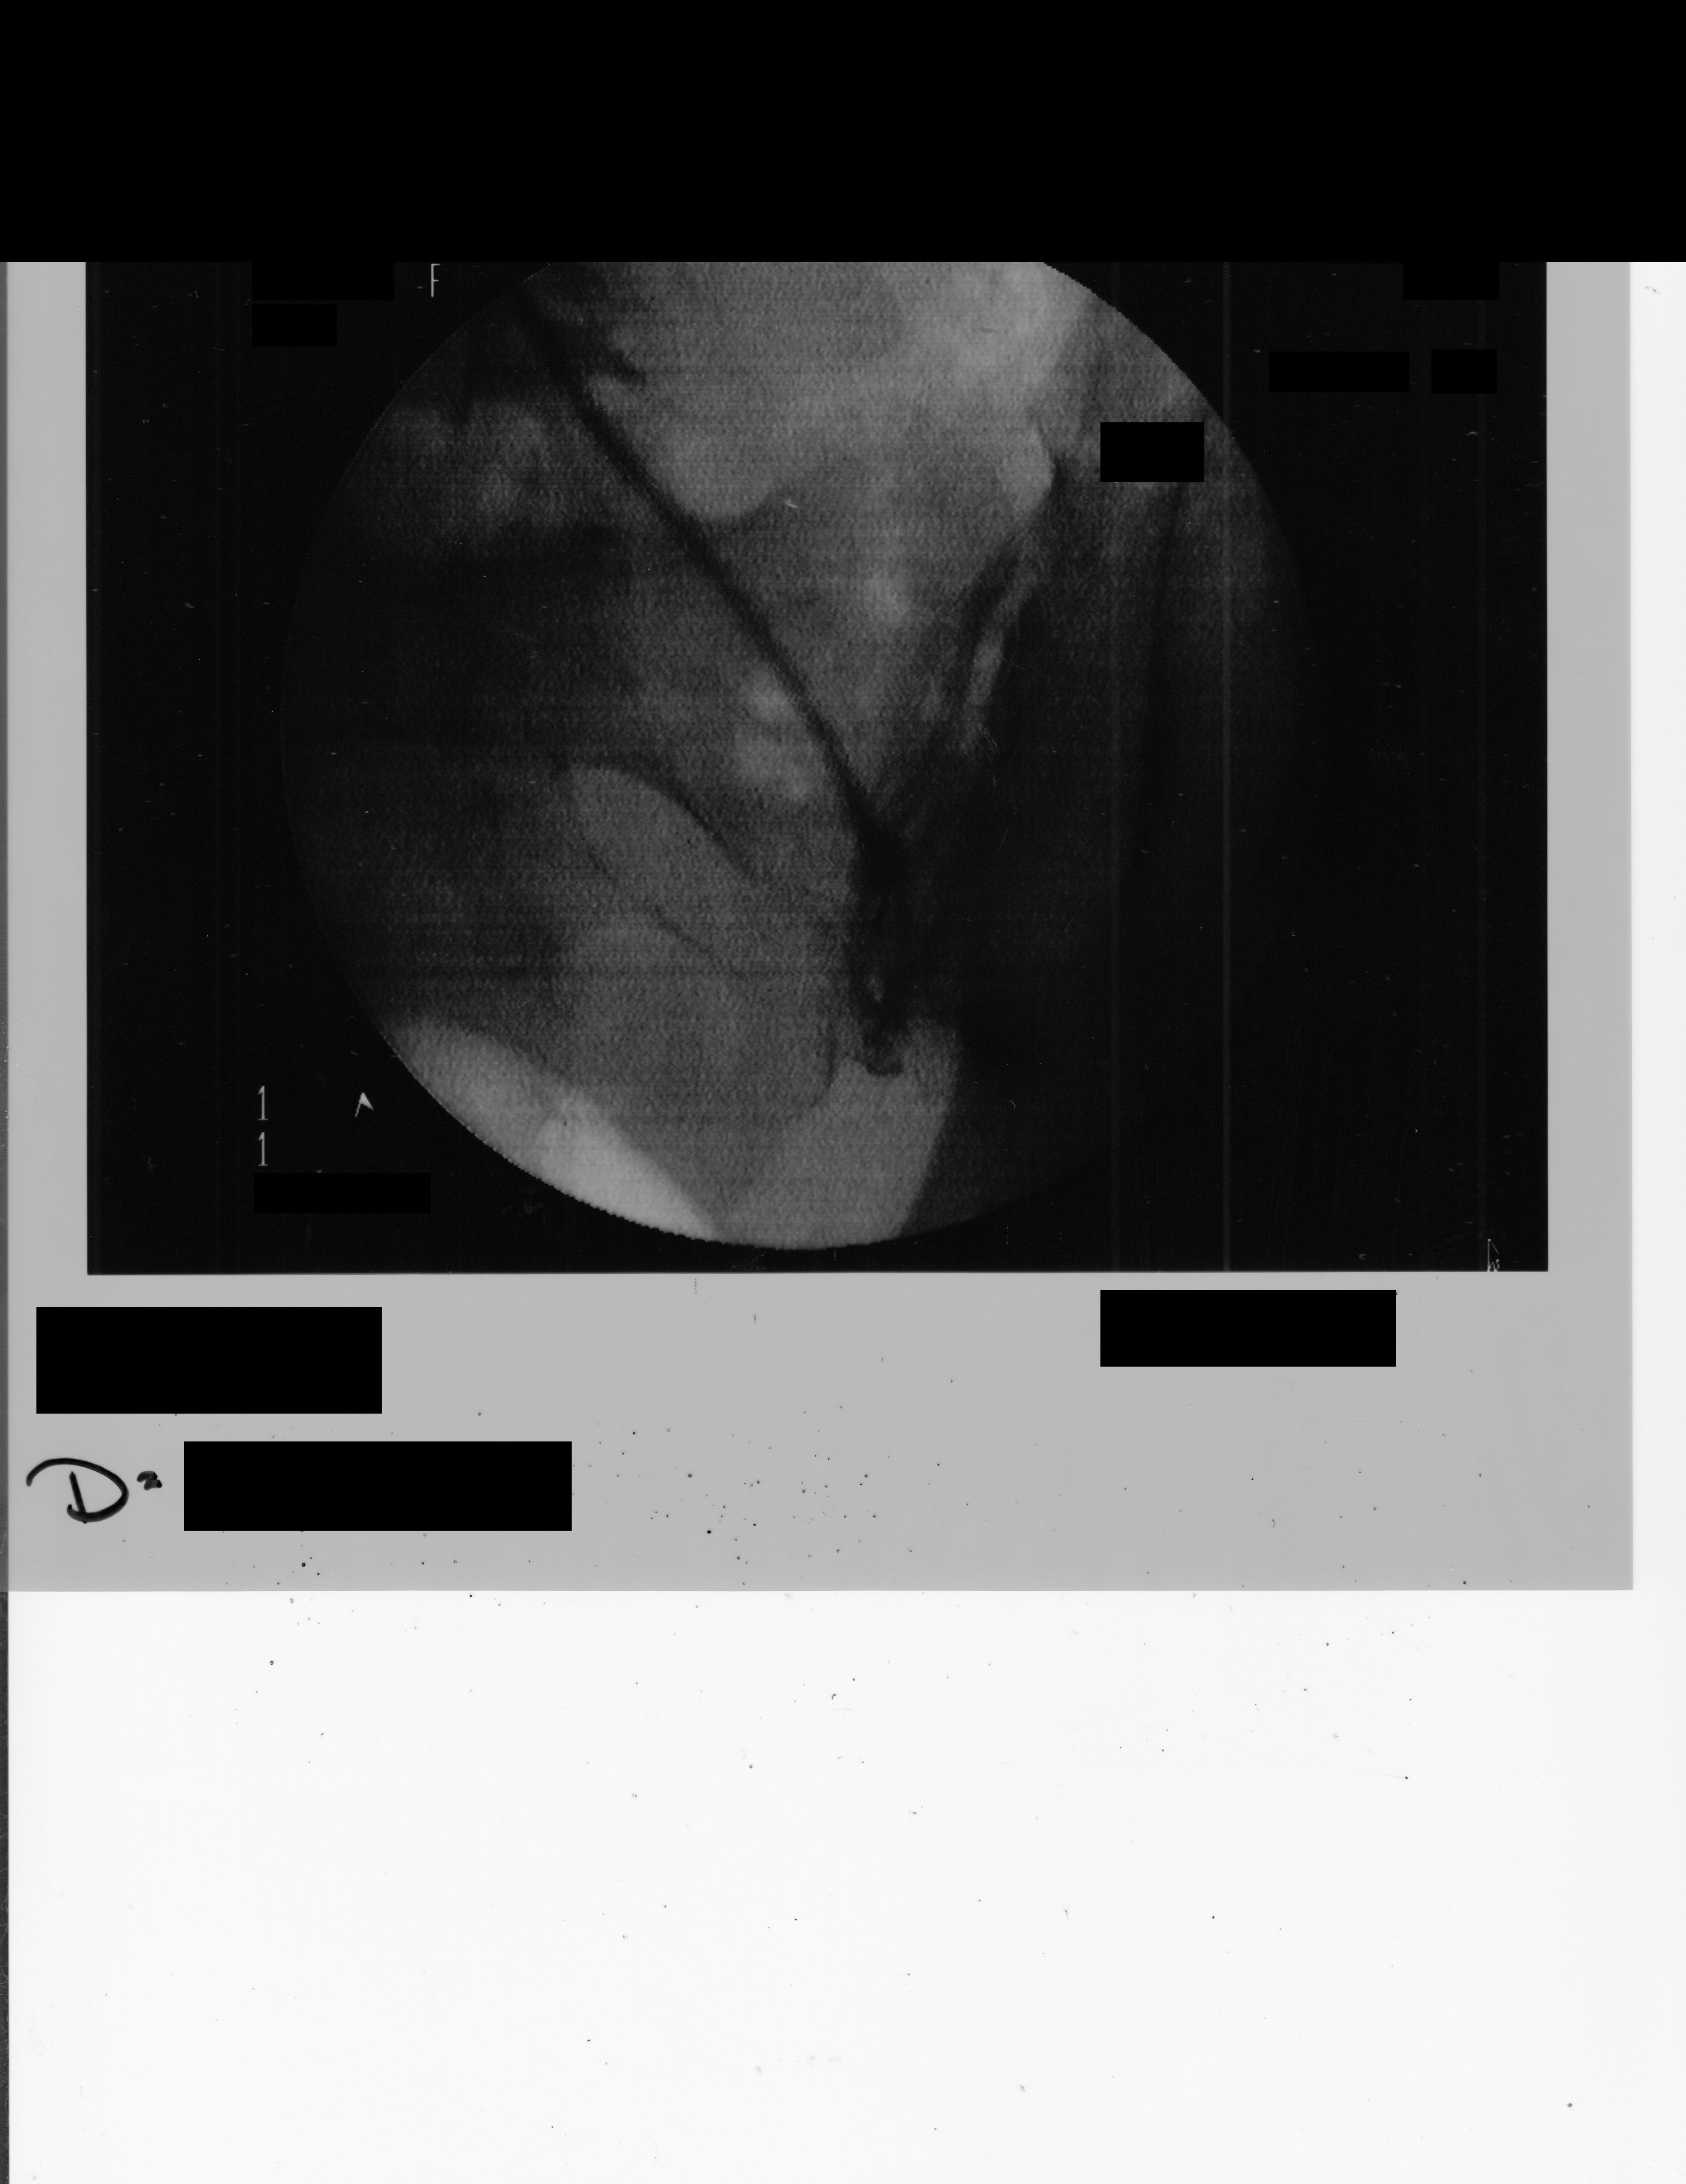

[1 of 1 positions shown; findings below may reference images not displayed]

FLUOROSCOPY TIME:  17 seconds corresponding to a dose of 0.15 Gy cm
squared

PROCEDURE:
RIGHT SI JOINT INJECTION.

Informed written consent was obtained.  Time-out was performed.

The patient was placed prone on the fluoroscopy table and
localization was performed over the sacrum. Target site marked using
fluoroscopic guidance. The skin was prepped and draped in the usual
sterile fashion using Betadine soap.

After local anesthesia with 1% lidocaine without epinephrine and
subsequent deep anesthesia, a 22 gauge spinal needle was advanced
into the RIGHT SI joint. Injection of 0.5 ml Isovue-M 200 confirmed
intra-articular placement. No vascular uptake present. Subsequently,
120.0 of Depo-Medrol along with 2 mL 0.5% Sensorcaine was injected
into SI joint. Needles removed and a sterile dressing applied.

No complications were observed. The patient was observed and
released under the care of a driver after 30 minutes.
IMPRESSION: Successful fluoroscopically guided RIGHT SI joint injection.
